# Patient Record
Sex: Female | Born: 1955 | Race: White | Hispanic: No | Smoking: Former smoker
Health system: Southern US, Community
[De-identification: ages and names within clinical notes are randomized; demographics above are authoritative.]

## PROBLEM LIST (undated history)

## (undated) DIAGNOSIS — M51369 Other intervertebral disc degeneration, lumbar region without mention of lumbar back pain or lower extremity pain: Secondary | ICD-10-CM

## (undated) DIAGNOSIS — K219 Gastro-esophageal reflux disease without esophagitis: Secondary | ICD-10-CM

## (undated) DIAGNOSIS — N3941 Urge incontinence: Secondary | ICD-10-CM

## (undated) DIAGNOSIS — F32A Depression, unspecified: Secondary | ICD-10-CM

## (undated) DIAGNOSIS — F3110 Bipolar disorder, current episode manic without psychotic features, unspecified: Secondary | ICD-10-CM

## (undated) DIAGNOSIS — K2270 Barrett's esophagus without dysplasia: Secondary | ICD-10-CM

## (undated) DIAGNOSIS — N200 Calculus of kidney: Secondary | ICD-10-CM

## (undated) DIAGNOSIS — F319 Bipolar disorder, unspecified: Secondary | ICD-10-CM

## (undated) DIAGNOSIS — M1990 Unspecified osteoarthritis, unspecified site: Secondary | ICD-10-CM

## (undated) DIAGNOSIS — F4310 Post-traumatic stress disorder, unspecified: Secondary | ICD-10-CM

## (undated) DIAGNOSIS — I10 Essential (primary) hypertension: Secondary | ICD-10-CM

## (undated) DIAGNOSIS — E039 Hypothyroidism, unspecified: Secondary | ICD-10-CM

## (undated) DIAGNOSIS — M5417 Radiculopathy, lumbosacral region: Secondary | ICD-10-CM

## (undated) DIAGNOSIS — E079 Disorder of thyroid, unspecified: Secondary | ICD-10-CM

## (undated) DIAGNOSIS — E119 Type 2 diabetes mellitus without complications: Secondary | ICD-10-CM

## (undated) DIAGNOSIS — T83721A Exposure of implanted vaginal mesh and other prosthetic materials into vagina, initial encounter: Secondary | ICD-10-CM

## (undated) DIAGNOSIS — K760 Fatty (change of) liver, not elsewhere classified: Secondary | ICD-10-CM

## (undated) DIAGNOSIS — G2581 Restless legs syndrome: Secondary | ICD-10-CM

## (undated) DIAGNOSIS — M5430 Sciatica, unspecified side: Secondary | ICD-10-CM

## (undated) DIAGNOSIS — Z87442 Personal history of urinary calculi: Secondary | ICD-10-CM

## (undated) DIAGNOSIS — L900 Lichen sclerosus et atrophicus: Secondary | ICD-10-CM

## (undated) DIAGNOSIS — F329 Major depressive disorder, single episode, unspecified: Secondary | ICD-10-CM

## (undated) DIAGNOSIS — F419 Anxiety disorder, unspecified: Secondary | ICD-10-CM

## (undated) DIAGNOSIS — M19019 Primary osteoarthritis, unspecified shoulder: Secondary | ICD-10-CM

## (undated) DIAGNOSIS — E7800 Pure hypercholesterolemia, unspecified: Secondary | ICD-10-CM

## (undated) HISTORY — PX: CYSTOURETHROSCOPY: SHX476

## (undated) HISTORY — PX: OOPHORECTOMY: SHX86

## (undated) HISTORY — PX: JOINT REPLACEMENT: SHX530

## (undated) HISTORY — PX: PERCUTANEOUS NEPHROSTOLITHOTOMY: SHX2207

## (undated) HISTORY — DX: Disorder of thyroid, unspecified: E07.9

## (undated) HISTORY — PX: CYSTOCELE REPAIR: SHX163

## (undated) HISTORY — DX: Major depressive disorder, single episode, unspecified: F32.9

## (undated) HISTORY — PX: TUBAL LIGATION: SHX77

## (undated) HISTORY — PX: MENISCECTOMY: SHX123

## (undated) HISTORY — DX: Unspecified osteoarthritis, unspecified site: M19.90

## (undated) HISTORY — PX: HERNIA REPAIR: SHX51

## (undated) HISTORY — PX: BREAST SURGERY: SHX581

## (undated) HISTORY — DX: Barrett's esophagus without dysplasia: K22.70

## (undated) HISTORY — DX: Type 2 diabetes mellitus without complications: E11.9

## (undated) HISTORY — DX: Gastro-esophageal reflux disease without esophagitis: K21.9

## (undated) HISTORY — DX: Depression, unspecified: F32.A

---
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
## 2007-08-13 HISTORY — PX: BLADDER SUSPENSION: SHX72

## 2008-02-29 ENCOUNTER — Ambulatory Visit: Payer: Self-pay | Admitting: Family Medicine

## 2008-03-15 ENCOUNTER — Ambulatory Visit: Payer: Self-pay | Admitting: Family Medicine

## 2008-04-01 ENCOUNTER — Ambulatory Visit: Payer: Self-pay | Admitting: Family Medicine

## 2008-04-06 ENCOUNTER — Ambulatory Visit: Payer: Self-pay | Admitting: Surgery

## 2008-04-11 ENCOUNTER — Ambulatory Visit: Payer: Self-pay | Admitting: Surgery

## 2008-04-11 HISTORY — PX: BREAST EXCISIONAL BIOPSY: SUR124

## 2008-08-12 HISTORY — PX: REPLACEMENT TOTAL KNEE: SUR1224

## 2009-06-12 ENCOUNTER — Ambulatory Visit: Payer: Self-pay | Admitting: Internal Medicine

## 2009-06-19 ENCOUNTER — Emergency Department: Payer: Self-pay | Admitting: Emergency Medicine

## 2009-06-23 ENCOUNTER — Ambulatory Visit: Payer: Self-pay | Admitting: Internal Medicine

## 2009-10-05 ENCOUNTER — Ambulatory Visit: Payer: Self-pay | Admitting: Family Medicine

## 2009-11-01 ENCOUNTER — Ambulatory Visit: Payer: Self-pay

## 2010-05-16 ENCOUNTER — Ambulatory Visit: Payer: Self-pay | Admitting: Surgery

## 2011-04-02 ENCOUNTER — Ambulatory Visit: Payer: Self-pay | Admitting: Family Medicine

## 2012-08-17 LAB — COMPREHENSIVE METABOLIC PANEL
Alkaline Phosphatase: 118 U/L (ref 50–136)
BUN: 29 mg/dL — ABNORMAL HIGH (ref 7–18)
Bilirubin,Total: 0.6 mg/dL (ref 0.2–1.0)
Chloride: 104 mmol/L (ref 98–107)
Co2: 22 mmol/L (ref 21–32)
Creatinine: 1.26 mg/dL (ref 0.60–1.30)
EGFR (Non-African Amer.): 48 — ABNORMAL LOW
Glucose: 132 mg/dL — ABNORMAL HIGH (ref 65–99)
Osmolality: 285 (ref 275–301)
SGOT(AST): 66 U/L — ABNORMAL HIGH (ref 15–37)

## 2012-08-17 LAB — ACETAMINOPHEN LEVEL: Acetaminophen: <2 ug/mL

## 2012-08-17 LAB — TSH: Thyroid Stimulating Horm: 83.7 u[IU]/mL — ABNORMAL HIGH

## 2012-08-17 LAB — CBC
HGB: 14.5 g/dL (ref 12.0–16.0)
MCHC: 34.4 g/dL (ref 32.0–36.0)
WBC: 9.1 10*3/uL (ref 3.6–11.0)

## 2012-08-18 ENCOUNTER — Inpatient Hospital Stay: Payer: Self-pay | Admitting: Psychiatry

## 2012-08-18 LAB — DRUG SCREEN, URINE
Amphetamines, Ur Screen: NEGATIVE (ref ?–1000)
Benzodiazepine, Ur Scrn: NEGATIVE (ref ?–200)
Cannabinoid 50 Ng, Ur ~~LOC~~: NEGATIVE (ref ?–50)
MDMA (Ecstasy)Ur Screen: NEGATIVE (ref ?–500)
Phencyclidine (PCP) Ur S: NEGATIVE (ref ?–25)

## 2012-08-20 LAB — LIPID PANEL
Ldl Cholesterol, Calc: 129 mg/dL — ABNORMAL HIGH (ref 0–100)
Triglycerides: 200 mg/dL (ref 0–200)
VLDL Cholesterol, Calc: 40 mg/dL (ref 5–40)

## 2012-08-23 LAB — TSH: Thyroid Stimulating Horm: 72.4 u[IU]/mL — ABNORMAL HIGH

## 2012-08-27 LAB — T4, FREE: Free Thyroxine: 0.71 ng/dL — ABNORMAL LOW (ref 0.76–1.46)

## 2012-09-14 ENCOUNTER — Ambulatory Visit: Payer: Self-pay | Admitting: Family Medicine

## 2012-09-16 ENCOUNTER — Ambulatory Visit: Payer: Self-pay | Admitting: Surgery

## 2013-03-21 ENCOUNTER — Inpatient Hospital Stay: Payer: Self-pay | Admitting: Psychiatry

## 2013-03-21 LAB — DRUG SCREEN, URINE
Amphetamines, Ur Screen: NEGATIVE (ref ?–1000)
Benzodiazepine, Ur Scrn: NEGATIVE (ref ?–200)
Cocaine Metabolite,Ur ~~LOC~~: NEGATIVE (ref ?–300)
MDMA (Ecstasy)Ur Screen: NEGATIVE (ref ?–500)
Methadone, Ur Screen: NEGATIVE (ref ?–300)
Opiate, Ur Screen: NEGATIVE (ref ?–300)
Phencyclidine (PCP) Ur S: NEGATIVE (ref ?–25)
Tricyclic, Ur Screen: NEGATIVE (ref ?–1000)

## 2013-03-21 LAB — COMPREHENSIVE METABOLIC PANEL
Anion Gap: 5 — ABNORMAL LOW (ref 7–16)
BUN: 16 mg/dL (ref 7–18)
Bilirubin,Total: 0.6 mg/dL (ref 0.2–1.0)
Chloride: 107 mmol/L (ref 98–107)
Co2: 25 mmol/L (ref 21–32)
Creatinine: 0.62 mg/dL (ref 0.60–1.30)
Glucose: 126 mg/dL — ABNORMAL HIGH (ref 65–99)
Osmolality: 277 (ref 275–301)
Potassium: 4.2 mmol/L (ref 3.5–5.1)

## 2013-03-21 LAB — TSH: Thyroid Stimulating Horm: 6.81 u[IU]/mL — ABNORMAL HIGH

## 2013-03-21 LAB — URINALYSIS, COMPLETE
Bacteria: NONE SEEN
Bilirubin,UR: NEGATIVE
Ph: 6 (ref 4.5–8.0)
RBC,UR: 3 /HPF (ref 0–5)
Squamous Epithelial: <1
WBC UR: 8 /HPF (ref 0–5)

## 2013-03-21 LAB — ACETAMINOPHEN LEVEL: Acetaminophen: 8 ug/mL — ABNORMAL LOW

## 2013-03-21 LAB — CBC
HCT: 36.4 % (ref 35.0–47.0)
MCH: 31.9 pg (ref 26.0–34.0)
MCHC: 35.1 g/dL (ref 32.0–36.0)
MCV: 91 fL (ref 80–100)
Platelet: 212 10*3/uL (ref 150–440)
RBC: 4.02 10*6/uL (ref 3.80–5.20)
RDW: 14 % (ref 11.5–14.5)

## 2013-03-21 LAB — ETHANOL: Ethanol: <3 mg/dL

## 2013-03-21 LAB — SALICYLATE LEVEL: Salicylates, Serum: 1.8 mg/dL

## 2013-04-23 ENCOUNTER — Ambulatory Visit: Payer: Self-pay | Admitting: Emergency Medicine

## 2013-08-25 ENCOUNTER — Ambulatory Visit: Payer: Self-pay | Admitting: Gastroenterology

## 2013-09-21 ENCOUNTER — Ambulatory Visit: Payer: Self-pay | Admitting: Gastroenterology

## 2013-10-16 ENCOUNTER — Ambulatory Visit: Payer: Self-pay | Admitting: Internal Medicine

## 2014-12-02 NOTE — Consult Note (Signed)
Chief Complaint:   Chief Complaint Paranoia   Presenting Symptoms:   Presenting Symptoms Anger/irritability  Sleep Disturbance  Mood Disturbance  Impaired Memory  Impaired Concentration  Apathy/Lethargy  Hallucinations  Delusions  Substance Use   History of Present Illness:   History of Present Illness Pt is a 59 yo WF admitted through the mobile crisis as she was found to be responding to internal stimuli, delusional and paranoid at the [employer]. She stated that she relocated to [place] 8 years ago and has been living with her ex husband and a room mate. She has a history of PTSD and recently found out that her mother passed away 2 days ago.  It really affected her and caused worsening of her PTSD symptoms. She does not want to go into the details of her symptoms She has rambling speech and was agitated and was unable to describe. She stated initially that she is in [place], then she said that she moved to [facility]. However, later she stated that she lives here. Stated that she baby sits her grand children but she cannot do it any more as she is feeling agiated and fuzy now. . It really affected her and caused worsening of her PTSD symptoms. She does not want to go into the details of her symptoms. However, she reported that she also used Crack with her friends. Her roommate gave her Vicodin to help with the back pain and it "messed her up".She continued to have rambling speech. She reported that her mood is very depressed and anxious. She remained tangential and was unable to contract for safety.   Target Symptoms:   Depressive Sleep Change  Suicidality    Manic Pressured Speech  Impaired Judgment  Impulsivity    Psychosis Disorganization  Negative Symptoms  Agitation  Paranoia    Behavior Impulsivity  Agitation  Aggression  Disinhibition    Arousal/Cognitive Altered Consciousness  Inattention  Behavior Disturbances   Past Psychiatric Treatment: First Treatment: Stated that she  was DX with PTSD in [place]. She was tried on several meds in the past. She had tried Lithium, Depakote, Haldol, Zyprexa, Risperdal, Seroquel, Thorazine, Prozac, Paxil and  Zoloft.   History of Suicide Attempts: Denied h/o suicide attempts.  Substance Abuse- Cocaine: Used crack recently .  Substance Abuse- Opiates: Used Vicodin from her friend recently.  Substance Abuse- Cannabis: The patient denies any cannabis use.Marland Kitchen  PAST MEDICAL & SURGICAL HX:  Significant Events:   Bipolar:    hyperthryoidism:    GERD - Esophageal Reflux:    Post-traumatic Stress Disorder:    Depression:    Osteoarthritis:    Chronic Myofascial Pain Syndrome:    Salpingo-Oophorectomy; Left \{Cysts\}:    Arthroscopy; Right Knee x 1:    Arthroscopy; Left Knee x 2:    Cesarean Section x 1:   CURRENT OUTPATIENT MEDICATIONS:  Home Medications: Medication Instructions Status  levothyroxine  orally  Active   Family History: The patient denies any history of mental illness in the family..  Social History: Lives with ex husband and room mate.  Mental Status Exam:   Speech Pressured    Mood Irritable    Affect Labile    Thought Processes Disorganized  Flight of ideas    Orientation Self  Place    Attention Lethargic    Memory Impaired    Fund of Knowledge Fair    Language Fair    Judgement Poor    Insight Poor    Reliabiity Fair  Suicide Risk Assessment: Suicide Risk Level No risk inidicated.  Review of Systems:  Review of Systems:   Fever/Chills No    Cough No    Sputum No    Abdominal Pain No    Diarrhea No    Constipation No    Nausea/Vomiting No    SOB/DOE No    Chest Pain No    Telemetry Reviewed Afib    Dysuria No    Medications/Allergies Reviewed Medications/Allergies reviewed   NURSING FLOWSHEETS:  Vital Signs/Nurse Notes-CM: ED Vital Sign Flow Sheet:   07-Jan-14 08:00   Vital Signs Type REFUSED VS   Assessment & Diagnosis: Axis I: PTSD   Psychotic DO NOS.   Axis II: none.   Axis III: See PMH.   Axis IV: Problems with primary support group  Problems related to social environment  Relationship problems .   Axis V: GAF 20.  Treatment Plan: Patient is aware of and understands the risks and benefits of the proposed treatment or treatment changes..   [facility] Start Seroquel 100mg  po qhs Prozac 20mg  po qam. Cogentin 1mg  po qhs  Group and milieu therapy Discharge planning once stable.  ELS  5-6 days.  Thank you for allowing me to participate me in the care of this pt.  Electronic Signatures: Rhunette CroftFaheem, Hyden Soley S (MD)  (Signed 07-Jan-14 11:21)  Authored: Chief Complaint, Presenting Symptoms, History of Present Illness, Target Symptoms, Past Psychiatric Treatment, Substance Abuse History, PAST MEDICAL & SURGICAL HX, CURRENT OUTPATIENT MEDICATIONS, Family History, Social History, Mental Status Exam, Suicide Risk Assessment, Review of Systems, NURSING FLOWSHEETS, Assessment & Diagnosis, Treatment Plan   Last Updated: 07-Jan-14 11:21 by Rhunette CroftFaheem, Taquila Leys S (MD)
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
## 2014-12-02 NOTE — H&P (Signed)
PATIENT NAME:  [name], [name] DATE OF BIRTH:  [birth year]  DATE OF ADMISSION:  08/18/2012  IDENTIFYING INFORMATION AND CHIEF COMPLAINT: A 59 year old woman admitted due to agitation, erratic thinking, mood instability.   CHIEF COMPLAINT: "I'm overwhelmed."   HISTORY OF PRESENT ILLNESS: The patient came to the Emergency Room stating that she was feeling overwhelmed. She presented to the initial observers as being disorganized in her thinking and probably psychotic. She tells me that she has been overwhelmed by multiple major life stresses. As a result, she recently has found herself emotionally unstable. Crying a great deal. Sleep has been erratic. Has had passive suicidal thoughts, but no active suicidal thoughts. Vaguely paranoid, but not obviously psychotic. Does have racing thoughts. Crying a great deal. Feels like she does not know what to do. She admits that she used some cocaine the other day and tries to minimize it, but does say that she has recently been doing that a little more frequently, maybe every couple of weeks. Denies that she is drinking alcohol frequently. She is not getting any outpatient psychiatric treatment despite a past history of psychiatric problems. Has not been on any psychiatric medication. It also appears that she has probably been noncompliant with her regular medical medication.   PAST PSYCHIATRIC HISTORY: Has never been seen at our hospital, but says that she had multiple admissions to hospitals in [place] where she lived before moving here about 9 years ago. She had been given a diagnosis of bipolar disorder and PTSD. She claims medication was never really helpful for her and she prefers psychotherapy. She particularly claims that lithium made her swell up. She admits that she has a past history of suicide attempts. Denies a history of violence.   SUBSTANCE ABUSE HISTORY: Currently has been using cocaine a little more frequently. Denies that she is  using any other drugs. She is very evasive on this subject.   PAST MEDICAL HISTORY: The patient had hyperthyroidism in the past and then was treated with radioablation and now has hypothyroidism. She claims that she has been taking her medication but does not know what her dosage, and her current labs suggests she probably has not been compliant. She also states that she has nausea and gastric reflux frequently that has been getting worse. She claims that she has been diagnosed with a ventral hernia. Says that it has been feeling more uncomfortable recently. She recently injured her right toe and is complaining that it is hurting a great deal.   SOCIAL HISTORY: The patient lives with her ex-husband who she now considers a roommate. Also another female roommate. She gets disability. She has children here in the area and seems to have a somewhat chaotic emotional relationship with them.   FAMILY HISTORY: She is estranged from her family of origin and does not have a clear idea.   CURRENT MEDICATIONS: All she can tell me is that she is on levothyroxine, does not remember the dose. Also has been taking 800 mg of Motrin once or twice a day p.r.n. for pain.   ALLERGIES: HALDOL AND TRAMADOL.   REVIEW OF SYSTEMS: Complains of feeling overwhelmed. Sad. Tearful. No active suicidal thoughts. Evasive about hallucinations. Multiple physical complaints including a painful toe, nausea, discomfort in her abdomen.   MENTAL STATUS EXAMINATION: Disheveled, poor hygiene looking woman. Cooperative but at times hard to redirect. Eye contact decreased. Psychomotor activity fidgety. Speech is at times rapid and pressured. Mood is labile, going from crying to  smiling very quickly. Overall tearful. Mood is stated as overwhelmed. Thoughts do not show obvious delusions, but she is quite disorganized with flight of ideas. Denies suicidal or homicidal ideation, but says that she would not mind if she died. Judgment and insight  questionable. Baseline intelligence probably normal.   PHYSICAL EXAMINATION:  GENERAL: Obese woman who looks her stated age. She has no obvious acute skin lesions, except that her right great toe does have a blackening under the toenail as though it has had a recent trauma. Her right great toe is also mildly swollen and red and appears to be tender to the touch.  HEENT: Pupils are equal and reactive. Face is symmetric.  NECK AND BACK: Nontender. EXTREMITIES: The patient walks with a limp favoring the injured toe. Otherwise has full range of motion at extremities.  ABDOMEN: Normal bowel sounds. I do not detect an obvious ventral hernia but could not rule it out.  LUNGS: Clear with no wheezes.  HEART: Regular rate and rhythm.  NEUROLOGIC: Strength and reflexes normal, symmetric. Cranial nerves intact.  VITAL SIGNS: Pulse 73, respirations 18, blood pressure 82/70.   ASSESSMENT: A 59 year old woman who appears to be presenting with a mixed manic, depressed episode of bipolar disorder. Could just be an agitated depression. Appeared more psychotic when she first came to the Emergency Room and has settled down now. Has been noncompliant with mental health treatment as well as medical treatment. Needs hospitalization for safety.   TREATMENT PLAN: Admit to psychiatry. I am going to provide nightly Seroquel plus p.r.n. Seroquel and Ativan during the day for anxiety. Will get an endocrine consult and start her back on an arbitrary initial dose of 100 mcg a day of Synthroid until the endocrinologist sees her. Get a podiatry consult if it can be done soon. Engage her in groups and activities and try and get some collateral information. Review labs.   DIAGNOSES PRINCIPAL AND PRIMARY:  AXIS I:  1.  Bipolar disorder, not otherwise specified.  2.  Cocaine abuse.  3.  Posttraumatic stress disorder by history.  AXIS II: Deferred.  AXIS III: Obesity, hypothyroidism, rule out ventral hernia, recent traumatic  injury to the toe.  AXIS IV: Moderate to severe from multiple recent stresses.  AXIS V: Functioning at time of evaluation: 35.   ____________________________ Audery Amel, MD jtc:jm D: 08/18/2012 17:52:11 ET T: 08/18/2012 18:45:08 ET JOB#: [id number]  cc: Audery Amel, MD, <Dictator> Audery Amel MD ELECTRONICALLY SIGNED 08/19/2012 10:11

## 2014-12-02 NOTE — Consult Note (Signed)
PATIENT NAME:  [name], TIETJE MR#:  [id number] DATE OF BIRTH:  [birth year]  DATE OF ADMISSION: 08/18/1998  DATE OF CONSULTATION:  08/27/2012  REFERRING PHYSICIAN:   CONSULTING PHYSICIAN:  Carmie End, MD  ADMITTING PHYSICIAN: Annett Gula, MD   PRIMARY CARE PHYSICIAN: Dr. Zada Finders   CHIEF COMPLAINT: Abdominal pain.   BRIEF HISTORY: The patient is a 59 year old woman seen on the behavior medicine unit for evaluation of a possible ventral hernia. She has had abdominal discomfort and a mass for some time, evaluated in the office several years ago. At the time of original evaluation, it was difficult to determine whether or not she had a hernia in the midline. She has no previous history of abdominal surgery in the upper midline and so it was felt that she would have an epigastric hernia, if there was indeed a hernia defect at all. She was overweight and it was difficult to be able to determine on clinical examination. We did not perform a CT scan at that time. She continued to have symptoms and underwent abdominal CT scan in October 2011. At that time, she had a small epigastric defect noted with only some preperitoneal fat in the defect. There did not appear to be any evidence of bowel in the defect. She had no further symptoms at the time. She was recently readmitted for anxiety and depression to the behavioral medicine unit. She has been evaluated there for the last 10 days. She is being set up for discharge and noted an increase in her symptoms and is concerned about a possibility of an obstruction. The surgical service was consulted.   PAST SURGICAL HISTORY:  She denies any previous upper abdominal surgery. Her only previous surgery has been a tubal, a partial hysterectomy, left oophorectomy and salpingectomy and a lower Pfannenstiel hernia repair. She has had no recent surgical intervention in her abdomen. She denies any nausea or vomiting, although she does have intermittent dysphagia. She has  no history of rectal bleeding or change in her stool habits. She has no history of cardiac disease, hypertension or diabetes.   CURRENT MEDICATIONS: Include ibuprofen, levothyroxine and Zyrtec.   ALLERGIES: SHE IS ALLERGIC TO HALDOL AND TRAMADOL. BOTH CAUSE GI DISTRESS.   FAMILY HISTORY: Noncontributory.   REVIEW OF SYSTEMS: Not performed.   PHYSICAL EXAMINATION: GENERAL: She is an alert, very anxious, but otherwise comfortable woman in no significant distress. She is afebrile. Heart rate 72 and regular, blood pressure is 148/87. She has normal oxygen saturation.  HEENT: Reveals no scleral icterus. No facial deformities. No pupillary abnormalities.  NECK: Supple without adenopathy or tenderness, with midline trachea.  PULMONARY: Chest is clear with distant breath sounds, but they appear to be equal. She has normal pulmonary excursion.  CARDIAC: Reveals no murmurs or gallops to my ear, and she seems to be in normal sinus rhythm.  ABDOMEN: Distended and obese. She does have what appears to be preperitoneal fat in an upper midline defect. It does appear larger than on the previous examination, I suspect on CT would also be larger.  Lower abdominal exam reveals no hernias, no masses and she has no significant abdominal tenderness.   EXTREMITIES: Lower extremity exam reveals full range of motion. No deformities. No edema.  PSYCHIATRIC: Deferred to the behavioral medical unit.   ASSESSMENT AND PLAN: I have independently reviewed her CT scan. She does have a small peritoneal defect on a previous scan, but has not had a recent evaluation. She likely does  have an epigastric hernia responsible for her current symptoms. There is certainly no bowel in this defect and no indication for urgent surgical intervention. At some point she may need to be considered for repeat CT scan and surgery if she so desires. This problem is certainly one that can be followed as an outpatient. We would consent to see her in  our office as an outpatient for further evaluation. I do not see any indication for surgical intervention urgently. This plan has been described with her in detail and she is in agreement.     ____________________________ Carmie Endalph L. Ely III, MD rle:cc D: 08/27/2012 18:34:00 ET T: 08/27/2012 19:02:12 ET JOB#: [id number]  cc: Quentin Orealph L. Ely III, MD, <Dictator> Dr. Adline Mangolmeda  Madgie Dhaliwal L ELY MD ELECTRONICALLY SIGNED 08/28/2012 8:49

## 2014-12-02 NOTE — Consult Note (Signed)
Chief Complaint and History:   Primary Physician Dr. Zada Finderslmedo, [facility] [place]    Referring Physician Dr. Toni Amendlapacs    Chief Complaint Hypothyroidism   Allergies:  Tramadol: N/V  Haldol: Other  Assessment/Plan:   Assessment/Plan 59 yo F admitted for psychosis and found to have uncontrolled hypothyroidism. She claims she had radioiodine for hyperthyroidism several years ago. She is a poor historian. She claims she is compliant with levothyroxine and that she takes it at midnight when she wakes from sleep to urinate. She claims she rarely msises a dose. Her TSH level on 08/17/12 was 83. I called her pharmacy, [facility] in [place], and was told she picked up a 90 day prescription for 150 mcg levothyroxine on 06/22/12.  A/ 1. Post-ablative hypothyroidism, uncontrolled  P/ 1. Increase levothyroxine to 150 mcg daily. 2. Check a TSH and free T4 in 4 days.  I will follow-up after repeat labs are done. A full consult will be dictated.    Case Discussed With patient   Electronic Signatures: Raj JanusSolum, Anna M (MD)  (Signed 09-Jan-14 13:33)  Authored: Chief Complaint and History, ALLERGIES, Assessment/Plan   Last Updated: 09-Jan-14 13:33 by Raj JanusSolum, Anna M (MD)

## 2014-12-02 NOTE — Consult Note (Signed)
PATIENT NAME:  [name], [name] MR#:  [id number] DATE OF BIRTH:  [birth year]  DATE OF CONSULTATION:  08/20/2012  REFERRING PHYSICIAN:  Gonzella Lex, MD CONSULTING PHYSICIAN:  A. Lavone Orn, MD  CHIEF COMPLAINT: Elevated TSH.   HISTORY OF PRESENT ILLNESS: This is a 59 year old female who was admitted on 08/18/2012 for concerns of psychosis. Initial labs showed an elevated TSH level of 83.7. The patient reports she has a history of hyperthyroidism. She claims that she was treated with radioiodine several years ago. Initially she stated this was 6 months ago and later said it was 3 years ago. She is a fairly poor historian. She claims her treatment was done at [facility] in [place]. She claims Dr. 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 She does not recall her dose of levothyroxine. She claims she takes it around midnight. She is aware she should be taking it on an empty stomach; thus, she waits until she wakes up in the middle of the night to urinate and then takes it. She claims she is fairly faithful about taking her levothyroxine. She estimates she maybe has missed 1 dose in the last month. She does feel cold. She does admit to depression. She reports her hair is frail and falling out. She reports dry skin. She has chronic constipation.   PAST MEDICAL HISTORY:  1.  Postablative hypothyroidism.  2.  History of hyperthyroidism, status post radioiodine ablation at [facility].  3.  Possible history of bipolar and PTSD.  4.  Tobacco dependence.  5.  Morbid obesity.   PAST SURGICAL HISTORY:  1.  Bilateral knee replacement.  2.  Bilateral salpingo-oophorectomy.  3.  Left breast lumpectomy.  4.  Bladder sling/repair.   SOCIAL HISTORY: The patient claims she lives with her son. She has 4 children in total. She been married 3 times. Her last husband was killed from a gunshot wound, which she witnessed. She is unemployed. She  smokes cigarettes. She claims she had not used cocaine for 9 years up until a recent relapse over the last several weeks. She smokes marijuana on occasion. She admits to use of her husband's prescription Vicodin, about 3 tabs per month. She has occasional alcohol use.   FAMILY HISTORY: Mother may have had hypothyroidism, details not clear. She states she has been out of touch with her family.   ALLERGIES: HALDOL AND TRAMADOL.   INPATIENT MEDICATIONS:  1.  Prilosec 40 mg b.i.d.  2.  Levothyroxine 100 mcg daily.  3.  Seroquel 100 mg at bedtime.   REVIEW OF SYSTEMS:  GENERAL: She has fatigue. She has had weight gain. She denies fevers.  HEENT: She denies blurred vision. She denies sore throat.  CARDIAC: She denies chest pain. She denies palpitations.  PULMONARY: She has dyspnea on exertion. She denies cough.  ABDOMEN: She reports abdominal discomfort. She is concerned there is a mass in her stomach that has been growing. She has constipation. She has fair appetite.  EXTREMITIES: She denies leg swelling.  SKIN: She denies rash or pruritus.  ENDOCRINE: She reports cold intolerance. She denies polyuria.  GENITOURINARY: She denies dysuria or hematuria.  PSYCHIATRIC: She reports anxiety and depression.   PHYSICAL EXAMINATION:  VITAL SIGNS: Height 66.9 inches, weight 242 pounds, BMI 37.9. Temperature 97.1, pulse 71, respirations 18, blood pressure 112/69.  GENERAL: Morbidly obese white female in no distress.  HEENT: EOMI. Oropharynx is clear. Mucous membranes moist.  NECK: Supple. No  thyromegaly. No palpable thyroid nodules.  CARDIAC: Regular rate and rhythm. No carotid bruit.  PULMONARY: Clear bilaterally. No wheeze. Good inspiratory effort.  ABDOMEN: Diffusely soft, nontender, nondistended. No appreciable masses. No hepatosplenomegaly appreciated.  EXTREMITIES: No edema is present.  SKIN: No rash or dermatopathy is present. No acanthosis nigricans is present.  PSYCHIATRIC: Disheveled, good  eye contact, calm, cooperative.   LABORATORY DATA: On 08/17/2012: Glucose 132, BUN 29, creatinine 1.26, potassium 3.5, sodium 139, eGFR 48, calcium 9.1. Ethanol less than 0.003. Total protein 9.1, albumin 5.1, AST 66, ALT 62. TSH 83.7. Urine drug screen positive for cocaine and opiates. Hemoglobin 14.5, hematocrit 42.1, WBC 9.1, platelets 246. Acetaminophen less than 2.0. Salicylate less than 1.7. On 08/20/2012: Total cholesterol 210, triglycerides 200, HDL 41, LDL 129.   ASSESSMENT: A 59 year old female with history of hyperthyroidism treated with radioiodine ablation sometime in the past, now with postablative hypothyroidism, which is uncontrolled. Suspect that her hypothyroidism is uncontrolled due to noncompliance with levothyroxine. I called her pharmacy, which is [facility] in [place]. I was told that she filled a prescription for levothyroxine 150 mcg 90-day supply on 06/22/2012. They had never received a prior prescription on levothyroxine for that patient.   RECOMMENDATIONS:  1.  Increase levothyroxine to 150 mcg per day.  2.  Continue giving in morning. I recommend morning dosing for best compliance, with waiting 30 to 60 minutes before having breakfast.  3.  Will repeat a free T4 and TSH in 4 days and reassess if she remains in-house.   Thank you for the kind request for consultation. I will follow along with you.    ____________________________ A. Lavone Orn, MD ams:jm D: 08/20/2012 13:42:49 ET T: 08/20/2012 15:00:26 ET JOB#: [id number]  cc: A. Lavone Orn, MD, <Dictator> Sherlon Handing MD ELECTRONICALLY SIGNED 09/03/2012 16:12

## 2014-12-02 NOTE — Discharge Summary (Signed)
PATIENT NAME:  [name], [name] DATE OF BIRTH:  14-Nov-1955  DATE OF ADMISSION:  08/18/2012 DATE OF DISCHARGE:  08/28/2012  HOSPITAL COURSE:  See dictated history and physical for details of admission. The patient was admitted to the hospital with depression, anxiety and confusion. Some suicidal ideation. In the hospital, she was treated for bipolar disorder which also may include elements of personality disorder and chronic anxiety. She has generally tolerated medication well. She has participated appropriately in groups and activities. She seems to have responded to medications. By the time of discharge, she is able to hold a calm, lucid conversation. Totally denies suicidal ideation. Able to identify positive things in her life that she is looking forward to. She still shows symptoms of chronic anxiety which may be a constant issue. She was agreeable however to following up with outpatient treatment in the community. She was treated also in the hospital for hypothyroidism, also was treated for chronic pain and gastric reflux symptoms.   DISCHARGE MEDICATIONS:  Quetiapine 50 mg p.o. q. 6 hours p.r.n. for anxiety, Synthroid 200 mcg p.o. q.a.m., Seroquel 300 mg at night, Neurontin 600 mg 3 times a day, Zyrtec 10 mg q.a.m., Celebrex 200 mg b.i.d., Ambien 10 mg at bedtime, hydroxyzine 50 mg 4 times a day, Prilosec 40 mg twice a day.   DIAGNOSTIC DATA:  Admission labs showed a chemistry panel with a slightly elevated glucose at 132, BUN elevated at 29. AST elevated at 66, total protein at 9.1, albumin at 5.1. Alcohol level was undetectable. CBC was normal. Salicylates normal. Thyroid-stimulating hormone was 83.7. Consultation was obtained with endocrinology. Drug screen was positive for cocaine and for opiates. Cholesterol was 210, LDL 129 and other lipids normal. Followup tests of thyroxine showed them to still be low even with supplementation. TSH was retested before discharge and had come down  just to 72. The patient complained of chronic discomfort in her abdomen and insinuated that she needed surgical consultation. An x-ray of the abdomen was eventually obtained and showed primarily a nonobstructive gas pattern and possible nephrolithiasis.   MENTAL STATUS EXAM AT DISCHARGE:  Casually dressed, reasonably well-groomed woman who looks her stated age. Somewhat irritable and grumpy still in interaction. Eye contact normal. Psychomotor activity a little fidgety. Speech is normal in rate, tone and volume. Affect anxious. Mood stated as being anxious. Thoughts are generally lucid. No sign of loosening of associations or delusions. Denies auditory or visual hallucinations. No sign of responding to internal stimuli. Denies suicidal or homicidal ideation. Shows decent insight and judgment. Normal intelligence.   DISPOSITION:  Discharge followup with [facility].   DIAGNOSIS, PRINCIPAL AND PRIMARY:  AXIS I: Bipolar disorder, not otherwise specified.   SECONDARY DIAGNOSES: AXIS I: Cocaine abuse.  AXIS II: Personality disorder, not otherwise specified, borderline features, prominent.  AXIS III: Hypothyroidism, chronic pain, gastric reflux.  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  AXIS V: Functioning at time of discharge 57.   ____________________________ Audery AmelJohn T. Seymore Brodowski, MD jtc:si D: 09/08/2012 17:15:00 ET T: 09/08/2012 17:50:34 ET JOB#: [id number]  cc: Audery AmelJohn T. Izayiah Tibbitts, MD, <Dictator> Audery AmelJOHN T Eleshia Wooley MD ELECTRONICALLY SIGNED 09/09/2012 18:32

## 2014-12-02 NOTE — Consult Note (Signed)
PATIENT NAME:  [name], [name] MR#:  [id number] DATE OF BIRTH:  Jun 26, 1956  DATE OF CONSULTATION:  08/20/2012  REFERRING PHYSICIAN:   CONSULTING PHYSICIAN:  Rhona RaiderMatthew G. Dejan Angert, DPM  REASON FOR CONSULTATION: Contusion of right great hallux nail. Date of injury is 08/17/2012.   HISTORY OF PRESENT ILLNESS: The patient was admitted to the hospital this week due to feelings of being overwhelmed. She is in behavior medicine. She states she hit her toe last Monday and it has been sore and painful since that timeframe. She noted immediate development of discoloration to the nail region with evidence of bleeding underneath the nail and quite a bit of pain. She has apparently done this at some point in the past because she describes it pretty well.   PAST MEDICAL HISTORY: She has iatrogenic hypothyroidism secondary to radioablation of hyperthyroid condition. She has had some issues with depression and is admitted for emotional issues at this time frame.   ALLERGIES: HALDOL AND TRAMADOL.   CURRENT MEDICATIONS:  1. Levothyroxine.  2. Motrin 800 mg periodically.   SOCIAL HISTORY: Positive for disability. Does admit to cocaine usage. She has an ex-husband she lives with. She has children in the area.   FAMILY HISTORY: Uncertain as she is estranged from her family.  PHYSICAL EXAMINATION:   GENERAL: She seems relatively alert at this point. She is a pleasant 59 year old Caucasian female. The only discomfort she has is in her great toe area.   LOWER EXTREMITY EXAM: Vascular DP pulses are +2/4 bilaterally.   DERMATOLOGIC: Skin texture and turgor is within normal limits. She has no ulcers, skin breaks or tissue breakdown. She has a contusion underneath the right hallux nail with a hematoma subungually on that region and some erythema and redness and swelling to the posterior nail fold at this juncture.   ORTHO: Good range of motion ankles, toes, metatarsal and metatarsophalangeal joints.   IMPRESSION:  Contusion of right hallux nail this past week.  TREATMENT PLAN: After an alcohol prep, I was able to go ahead and make two release holes in the nail area using an 18-gauge beveled needle. This allowed me to penetrate down into the nail to get below the subungual portion of it and allow drainage of the hematoma. Two such holes were placed to allow for better drainage. She tolerated that well. I think she will get a lot more comfort out of it. We cleaned it with alcohol at bedside. This was done by her bedside. No anesthesia was necessary. We put a dressing on the area at this time frame. She is to let me know if there are any particular problems. I spent some time talking to her about expectations and as the nail grows out it is going to loosen and fall off as the new nail grows. She is going to keep a Band-Aid on it. If it gets too loose or starts to irritate her or if she has any problems with it she can give my office a call and we will set her up to try to help her out with it. ____________________________ Rhona RaiderMatthew G. Texanna Hilburn, DPM mgt:sb D: 08/20/2012 15:46:41 ET T: 08/20/2012 16:08:44 ET JOB#: [id number]  cc: Rhona RaiderMatthew G. Dmarco Baldus, DPM, <Dictator> Epimenio SarinMATTHEW G Franny Selvage MD ELECTRONICALLY SIGNED 10/06/2012 14:31

## 2015-09-13 ENCOUNTER — Encounter: Payer: Self-pay | Admitting: *Deleted

## 2015-09-13 ENCOUNTER — Encounter: Payer: Medicare Other | Attending: Family Medicine | Admitting: *Deleted

## 2015-09-13 VITALS — BP 102/70 | Ht 67.0 in | Wt 241.4 lb

## 2015-09-13 DIAGNOSIS — E119 Type 2 diabetes mellitus without complications: Secondary | ICD-10-CM | POA: Diagnosis present

## 2015-09-13 NOTE — Patient Instructions (Addendum)
Check blood sugars 2 x day before breakfast and 2 hrs after supper 3-4 x week Exercise: Begin chair exercises  for   10-15  minutes  3  days a week Eat 3 meals day,  1-2 snacks a day Space meals 4-6 hours apart Don't skip meals Allow 2-3 hours between meals and snacks Avoid sugar sweetened drinks (soda, tea) Avoid adding sugar to cereal Bring blood sugar records to the next class

## 2015-09-14 NOTE — Progress Notes (Signed)
Diabetes Self-Management Education  Visit Type: First/Initial  Appt. Start Time: 0850 Appt. End Time: 1010  09/13/2015  [name], identified by name and date of birth, is a 60 y.o. female with a diagnosis of Diabetes: Type 2.   ASSESSMENT  Blood pressure 102/70, height  (1.702 m), weight 241 lb 6.4 oz (109.498 kg). Body mass index is 37.8 kg/(m^2).      Diabetes Self-Management Education - 09/13/15 1218    Visit Information   Visit Type First/Initial   Initial Visit   Diabetes Type Type 2   Are you taking your medications as prescribed? No   Date Diagnosed 2 years ago   Health Coping   How would you rate your overall health? Poor   Psychosocial Assessment   Patient Belief/Attitude about Diabetes Defeat/Burnout  "depressed"   Self-care barriers None   Self-management support Doctor's office   Patient Concerns Nutrition/Meal planning;Glycemic Control;Problem Solving;Weight Control;Healthy Lifestyle   Special Needs None   Preferred Learning Style Auditory;Visual;Hands on   Learning Readiness Ready   How often do you need to have someone help you when you read instructions, pamphlets, or other written materials from your doctor or pharmacy? 1 - Never   What is the last grade level you completed in school? 2 yrs college   Complications   Last HgB A1C per patient/outside source 6.6 %  08/30/15   How often do you check your blood sugar? 0 times/day (not testing)  Pt has a meter but hasn't checked blood sugars in last year.    Have you had a dilated eye exam in the past 12 months? Yes   Have you had a dental exam in the past 12 months? No  dentures   Are you checking your feet? No   Dietary Intake   Breakfast usually skips or eats egge, toast and fried potatoes or muffin or cereal and milk wtih sugar sprinkled on cereal   Lunch burrito or sandwich or soup   Snack (afternoon) Chips - snacks all throughout the day   Dinner chicken or potato soup or spaghetti or rice  with green beans   Beverage(s) root beer, sweet tea, diet soda, water   Exercise   Exercise Type ADL's   Patient Education   Previous Diabetes Education No   Disease state  Definition of diabetes, type 1 and 2, and the diagnosis of diabetes   Nutrition management  Role of diet in the treatment of diabetes and the relationship between the three main macronutrients and blood glucose level   Physical activity and exercise  Role of exercise on diabetes management, blood pressure control and cardiac health.   Medications Reviewed patients medication for diabetes, action, purpose, timing of dose and side effects.   Chronic complications Relationship between chronic complications and blood glucose control   Psychosocial adjustment Identified and addressed patients feelings and concerns about diabetes   Personal strategies to promote health Lifestyle issues that need to be addressed for better diabetes care  stop smoking   Individualized Goals (developed by patient)   Reducing Risk Improve blood sugars Decrease medications Prevent diabetes complications Lose weight Lead a healthier lifestyle   Outcomes   Expected Outcomes Demonstrated interest in learning. Expect positive outcomes      Individualized Plan for Diabetes Self-Management Training:   Learning Objective:  Patient will have a greater understanding of diabetes self-management. Patient education plan is to attend individual and/or group sessions per assessed needs and concerns.   Plan:  Patient Instructions  Check blood sugars 2 x day before breakfast and 2 hrs after supper 3-4 x week Exercise: Begin chair exercises  for   10-15  minutes  3  days a week Eat 3 meals day,  1-2 snacks a day Space meals 4-6 hours apart Don't skip meals Allow 2-3 hours between meals and snacks Avoid sugar sweetened drinks (soda, tea) Avoid adding sugar to cereal Bring blood sugar records to the next class  Expected Outcomes:  Demonstrated  interest in learning. Expect positive outcomes  Education material provided:  General Meal Planning Guidelines Simple Meal Plan  If problems or questions, patient to contact team via:   Sharion Settler, RN, CCM, CDE [phone]  Future DSME appointment:  September 21, 2015 for Class 1

## 2015-09-21 ENCOUNTER — Ambulatory Visit: Payer: Medicare Other

## 2015-09-28 ENCOUNTER — Ambulatory Visit: Payer: Medicare Other

## 2015-10-05 ENCOUNTER — Ambulatory Visit: Payer: Medicare Other

## 2015-10-12 ENCOUNTER — Ambulatory Visit: Payer: Medicare Other

## 2015-10-19 ENCOUNTER — Ambulatory Visit: Payer: Medicare Other

## 2015-10-24 ENCOUNTER — Encounter: Payer: Self-pay | Admitting: *Deleted

## 2015-10-25 ENCOUNTER — Ambulatory Visit: Payer: Medicare Other

## 2015-11-30 ENCOUNTER — Other Ambulatory Visit: Payer: Self-pay | Admitting: Family Medicine

## 2015-11-30 DIAGNOSIS — Z1231 Encounter for screening mammogram for malignant neoplasm of breast: Secondary | ICD-10-CM

## 2015-12-06 ENCOUNTER — Ambulatory Visit: Payer: Medicare Other | Attending: Family Medicine

## 2016-01-10 ENCOUNTER — Ambulatory Visit: Payer: Medicare Other

## 2016-01-17 ENCOUNTER — Ambulatory Visit: Admission: RE | Admit: 2016-01-17 | Payer: Medicare Other | Source: Ambulatory Visit

## 2016-01-18 ENCOUNTER — Ambulatory Visit
Admission: RE | Admit: 2016-01-18 | Discharge: 2016-01-18 | Disposition: A | Discharge status: Home / Self Care | Payer: Medicare Other | Source: Ambulatory Visit | Attending: Family Medicine | Admitting: Family Medicine

## 2016-01-18 DIAGNOSIS — Z1231 Encounter for screening mammogram for malignant neoplasm of breast: Secondary | ICD-10-CM | POA: Insufficient documentation

## 2016-04-09 ENCOUNTER — Encounter: Payer: Self-pay | Admitting: *Deleted

## 2016-04-09 ENCOUNTER — Ambulatory Visit
Admission: EM | Admit: 2016-04-09 | Discharge: 2016-04-09 | Disposition: A | Discharge status: Home / Self Care | Payer: Medicare Other | Attending: Family Medicine | Admitting: Family Medicine

## 2016-04-09 DIAGNOSIS — E079 Disorder of thyroid, unspecified: Secondary | ICD-10-CM | POA: Insufficient documentation

## 2016-04-09 DIAGNOSIS — N390 Urinary tract infection, site not specified: Secondary | ICD-10-CM | POA: Diagnosis not present

## 2016-04-09 DIAGNOSIS — E119 Type 2 diabetes mellitus without complications: Secondary | ICD-10-CM | POA: Insufficient documentation

## 2016-04-09 DIAGNOSIS — K219 Gastro-esophageal reflux disease without esophagitis: Secondary | ICD-10-CM | POA: Insufficient documentation

## 2016-04-09 DIAGNOSIS — Z7984 Long term (current) use of oral hypoglycemic drugs: Secondary | ICD-10-CM | POA: Insufficient documentation

## 2016-04-09 DIAGNOSIS — Z7982 Long term (current) use of aspirin: Secondary | ICD-10-CM | POA: Diagnosis not present

## 2016-04-09 DIAGNOSIS — F17210 Nicotine dependence, cigarettes, uncomplicated: Secondary | ICD-10-CM | POA: Diagnosis not present

## 2016-04-09 DIAGNOSIS — M5431 Sciatica, right side: Secondary | ICD-10-CM | POA: Diagnosis not present

## 2016-04-09 DIAGNOSIS — Z79899 Other long term (current) drug therapy: Secondary | ICD-10-CM | POA: Diagnosis not present

## 2016-04-09 DIAGNOSIS — M25512 Pain in left shoulder: Secondary | ICD-10-CM | POA: Diagnosis present

## 2016-04-09 LAB — URINALYSIS COMPLETE WITH MICROSCOPIC (ARMC ONLY)
Bilirubin Urine: NEGATIVE
Glucose, UA: NEGATIVE mg/dL
Ketones, ur: NEGATIVE mg/dL
Nitrite: NEGATIVE
Protein, ur: 100 mg/dL — AB
Specific Gravity, Urine: >1.03 — ABNORMAL HIGH (ref 1.005–1.030)
pH: 5 (ref 5.0–8.0)

## 2016-04-09 LAB — GLUCOSE, CAPILLARY: Glucose-Capillary: 94 mg/dL (ref 65–99)

## 2016-04-09 MED ORDER — CYCLOBENZAPRINE HCL 10 MG PO TABS: 10.0000 mg | ORAL_TABLET | Freq: Two times a day (BID) | ORAL | 0 refills | Status: DC | PRN

## 2016-04-09 MED ORDER — CEPHALEXIN 500 MG PO CAPS: 500.0000 mg | ORAL_CAPSULE | Freq: Two times a day (BID) | ORAL | 0 refills | Status: DC

## 2016-04-09 MED ORDER — HYDROCODONE-ACETAMINOPHEN 5-325 MG PO TABS: 1.0000 | ORAL_TABLET | Freq: Four times a day (QID) | ORAL | 0 refills | Status: DC | PRN

## 2016-04-09 NOTE — ED Provider Notes (Signed)
CSN: [id number]     Arrival date & time 04/09/16  0930 History   First MD Initiated Contact with Patient 04/09/16 1041     Chief Complaint  Patient presents with  . Back Pain  . Blood Sugar Problem   (Consider location/radiation/quality/duration/timing/severity/associated sxs/prior Treatment) HPI  This a 60 year old female who presents with several complaints. She complains of left shoulder pain which was injected by Dr. Gavin Potters and both feet pain which had been injected by a podiatrist. His shots contain prednisone. Noticed that her blood sugar hasn't increased to 190 and she has excessive thirst most likely from the prednisone.  Her most pressing problem is that of right-sided sciatica that she feels in her right buttock and radiates down into her leg. She states that she has been rock hunting and tailing bending and lifting [place] small rocks which is a fun activity for the city in [place]. June is to increase her exercise in order to decrease her diabetes.  Finally she's been having some urgency with urination. She has had a year urinary tract infections in the past but has been a long time. She has no vaginal discharge. She has a known kidney stone that has not moved in many many years.  Past Medical History:  Diagnosis Date  . Arthritis   . Barrett esophagus   . Breast mass 04/02/2011   mass removed from left negative  . Depression   . Diabetes mellitus without complication (HCC)   . GERD (gastroesophageal reflux disease)   . Thyroid disease    Past Surgical History:  Procedure Laterality Date  . BLADDER SUSPENSION  2009  . CESAREAN SECTION    . LAPAROSCOPIC SALPINGOOPHERECTOMY Left 1976  . MENISCECTOMY Bilateral 1980, 2000  . REPLACEMENT TOTAL KNEE Left 2010   Family History  Problem Relation Age of Onset  . Breast cancer Sister 33    half sister on fathers side   Social History  Substance Use Topics  . Smoking status: Current Some Day Smoker    Years: 10.00    Types:  Cigarettes  . Smokeless tobacco: Never Used     Comment: few cigarettes per week  . Alcohol use No   OB History    No data available     Review of Systems  Constitutional: Positive for activity change and fatigue. Negative for chills, diaphoresis and fever.  Musculoskeletal: Positive for back pain.  All other systems reviewed and are negative.   Allergies  Chloraseptic max sore throat  [phenol-glycerin]; Tramadol; and Haloperidol decanoate  Home Medications   Prior to Admission medications   Medication Sig Start Date End Date Taking? Authorizing Provider  cetirizine (ZYRTEC) 10 MG tablet Take 10 mg by mouth daily as needed. 02/01/15 04/09/16 Yes Historical Provider, MD  esomeprazole (NEXIUM) 20 MG capsule Take 1 capsule by mouth 2 (two) times daily. 02/22/15 04/09/16 Yes Historical Provider, MD  levothyroxine (SYNTHROID, LEVOTHROID) 175 MCG tablet Take 175 mcg by mouth daily. 09/01/15 08/31/16 Yes Historical Provider, MD  meloxicam (MOBIC) 15 MG tablet Take 15 mg by mouth daily. 08/30/15 08/29/16 Yes Historical Provider, MD  metFORMIN (GLUCOPHAGE-XR) 500 MG 24 hr tablet Take 2 tablets by mouth daily with supper. 02/22/15 04/09/16 Yes Historical Provider, MD  sitaGLIPtin (JANUVIA) 100 MG tablet Take 100 mg by mouth daily.   Yes Historical Provider, MD  aspirin EC 81 MG tablet Take 81 mg by mouth daily.    Historical Provider, MD  cephALEXin (KEFLEX) 500 MG capsule Take 1 capsule (500  mg total) by mouth 2 (two) times daily. 04/09/16   Lutricia Feil, PA-C  clonazePAM (KLONOPIN) 0.5 MG tablet Take 0.5 mg by mouth daily as needed.    Historical Provider, MD  cyclobenzaprine (FLEXERIL) 10 MG tablet Take 1 tablet (10 mg total) by mouth 2 (two) times daily as needed for muscle spasms. 04/09/16   Lutricia Feil, PA-C  HYDROcodone-acetaminophen (NORCO/VICODIN) 5-325 MG tablet Take 1 tablet by mouth every 6 (six) hours as needed for moderate pain. 04/09/16   Lutricia Feil, PA-C  lisinopril  (PRINIVIL,ZESTRIL) 2.5 MG tablet Take 2.5 mg by mouth daily. 09/01/15 08/31/16  Historical Provider, MD   Meds Ordered and Administered this Visit  Medications - No data to display  BP (!) 147/90 (BP Location: Left Arm)   Pulse 75   Temp 98 F (36.7 C) (Oral)   Resp (!) 22   Ht 5\' 7"  (1.702 m)   Wt 242 lb (109.8 kg)   SpO2 96%   BMI 37.90 kg/m  No data found.   Physical Exam  Constitutional: She is oriented to person, place, and time. She appears well-developed and well-nourished. No distress.  HENT:  Head: Normocephalic and atraumatic.  Eyes: EOM are normal. Pupils are equal, round, and reactive to light. Right eye exhibits no discharge. Left eye exhibits no discharge.  Neck: Normal range of motion. Neck supple.  Musculoskeletal: She exhibits tenderness. She exhibits no edema or deformity.  Examination of the spine is performed in the presence of Heather, RN acting as Nurse, children's. Patient has a level pelvis in stance. Maximal tenderness is over the right sacroiliac joint inferiorly. Right lateral flexion also exacerbates her symptoms. She is able to toe and heel walk adequately. DTRs are symmetrical bilaterally. Straight leg raise testing on the right causes mild sciatic notch tenderness. There is no cross component present.  Neurological: She is alert and oriented to person, place, and time.  Skin: Skin is warm and dry. No rash noted. She is not diaphoretic. No erythema.  Psychiatric: She has a normal mood and affect. Her behavior is normal. Judgment and thought content normal.  Nursing note and vitals reviewed.   Urgent Care Course   Clinical Course    Procedures (including critical care time)  Labs Review Labs Reviewed  URINALYSIS COMPLETEWITH MICROSCOPIC ([facility] ONLY) - Abnormal; Notable for the following:       Result Value   Specific Gravity, Urine >1.030 (*)    Hgb urine dipstick LARGE (*)    Protein, ur 100 (*)    Leukocytes, UA TRACE (*)    Bacteria,  UA MANY (*)    Squamous Epithelial / LPF 0-5 (*)    All other components within normal limits  URINE CULTURE  GLUCOSE, CAPILLARY  CBG MONITORING, ED    Imaging Review No results found.   Visual Acuity Review  Right Eye Distance:   Left Eye Distance:   Bilateral Distance:    Right Eye Near:   Left Eye Near:    Bilateral Near:         MDM   1. UTI (lower urinary tract infection)   2. Sciatica of right side    Discharge Medication List as of 04/09/2016 11:39 AM    START taking these medications   Details  cephALEXin (KEFLEX) 500 MG capsule Take 1 capsule (500 mg total) by mouth 2 (two) times daily., Starting Tue 04/09/2016, Normal    cyclobenzaprine (FLEXERIL) 10 MG tablet Take 1 tablet (10  mg total) by mouth 2 (two) times daily as needed for muscle spasms., Starting Tue 04/09/2016, Normal    HYDROcodone-acetaminophen (NORCO/VICODIN) 5-325 MG tablet Take 1 tablet by mouth every 6 (six) hours as needed for moderate pain., Starting Tue 04/09/2016, Print      Plan: 1. Test/x-ray results and diagnosis reviewed with patient 2. rx as per orders; risks, benefits, potential side effects reviewed with patient 3. Recommend supportive treatment with Rest and symptom avoidance. Patient does have a history of kidney stones and with the amount of RBCs in her urine today it is possible that she is passing the stone. Told her to observe her urine for possible stones. If she worsens she should go to emergency department. She is to follow-up with Dr. Gavin PottersGrandis next week. For her sciatica I have recommended that she curtail her rock hunting for the short-term . I believe that she is overdoing it. I provided her with a very few pain pills for the possible kidney stone and have told her to continue with her Mobic but not to add any Advil or Aleve to the regimen as the Mobic will be covering that. He tells me that she does take numerous OTC preparations for the pain told her the Mobic should be  sufficient.  4. F/u prn if symptoms worsen or don't improve     Lutricia FeilWilliam P Roemer, PA-C 04/09/16 1153

## 2016-04-09 NOTE — ED Triage Notes (Signed)
Patient has arrived with multiple complaints and is very agitated. Patient complaining mostly of right hip pain and high blood sugar.

## 2016-04-11 LAB — URINE CULTURE
Culture: NO GROWTH
Special Requests: NORMAL

## 2016-04-13 ENCOUNTER — Emergency Department
Admission: EM | Admit: 2016-04-13 | Discharge: 2016-04-13 | Disposition: A | Discharge status: Home / Self Care | Payer: Medicare Other | Attending: Emergency Medicine | Admitting: Emergency Medicine

## 2016-04-13 ENCOUNTER — Encounter: Payer: Self-pay | Admitting: Emergency Medicine

## 2016-04-13 DIAGNOSIS — M549 Dorsalgia, unspecified: Secondary | ICD-10-CM | POA: Diagnosis present

## 2016-04-13 DIAGNOSIS — Z7982 Long term (current) use of aspirin: Secondary | ICD-10-CM | POA: Diagnosis not present

## 2016-04-13 DIAGNOSIS — Z79899 Other long term (current) drug therapy: Secondary | ICD-10-CM | POA: Diagnosis not present

## 2016-04-13 DIAGNOSIS — M5431 Sciatica, right side: Secondary | ICD-10-CM

## 2016-04-13 DIAGNOSIS — R3121 Asymptomatic microscopic hematuria: Secondary | ICD-10-CM | POA: Insufficient documentation

## 2016-04-13 DIAGNOSIS — Z87442 Personal history of urinary calculi: Secondary | ICD-10-CM | POA: Diagnosis not present

## 2016-04-13 DIAGNOSIS — E119 Type 2 diabetes mellitus without complications: Secondary | ICD-10-CM | POA: Insufficient documentation

## 2016-04-13 DIAGNOSIS — Z7984 Long term (current) use of oral hypoglycemic drugs: Secondary | ICD-10-CM | POA: Insufficient documentation

## 2016-04-13 DIAGNOSIS — F17210 Nicotine dependence, cigarettes, uncomplicated: Secondary | ICD-10-CM | POA: Diagnosis not present

## 2016-04-13 DIAGNOSIS — R3129 Other microscopic hematuria: Secondary | ICD-10-CM

## 2016-04-13 LAB — URINALYSIS COMPLETE WITH MICROSCOPIC (ARMC ONLY)
Bilirubin Urine: NEGATIVE
Glucose, UA: NEGATIVE mg/dL
KETONES UR: NEGATIVE mg/dL
Nitrite: NEGATIVE
PH: 5 (ref 5.0–8.0)
PROTEIN: NEGATIVE mg/dL
SQUAMOUS EPITHELIAL / LPF: NONE SEEN
Specific Gravity, Urine: 1.018 (ref 1.005–1.030)

## 2016-04-13 MED ORDER — HYDROCODONE-ACETAMINOPHEN 5-325 MG PO TABS: 1.0000 | ORAL_TABLET | Freq: Four times a day (QID) | ORAL | 0 refills | Status: DC | PRN

## 2016-04-13 MED ORDER — KETOROLAC TROMETHAMINE 60 MG/2ML IM SOLN
30.0000 mg | Freq: Once | INTRAMUSCULAR | Status: AC
Start: 2016-04-13 — End: 2016-04-13
  Administered 2016-04-13: 30 mg via INTRAMUSCULAR

## 2016-04-13 MED ORDER — KETOROLAC TROMETHAMINE 30 MG/ML IJ SOLN
INTRAMUSCULAR | Status: AC
  Filled 2016-04-13: qty 1

## 2016-04-13 MED ORDER — KETOROLAC TROMETHAMINE 10 MG PO TABS
10.0000 mg | ORAL_TABLET | Freq: Three times a day (TID) | ORAL | 0 refills | Status: DC
Start: 2016-04-13 — End: 2017-12-22

## 2016-04-13 NOTE — ED Provider Notes (Signed)
[facility] Emergency Department Provider Note ____________________________________________  Time seen: 2015  I have reviewed the triage vital signs and the nursing notes.  HISTORY  Chief Complaint  Back Pain and Sciatica  HPI [name] is a 60 y.o. female presents to the ED after being evaluated at [facility] urgent care earlier this week with current treatment for a possibleUTI and stone passage. Patient presents today with multiple concerns related to her chronic and ongoing sciatica, chronic back pain, rotator cuff tendinopathy, and plantar fasciitis. She was treated with a small perception for pain medicine as well as a 10-day prescription for antibiotics and muscle relaxant. She presents now with multiple nonspecific complaints, reporting that she is unable to see her primary care provider until next week. Patient denies any interim injury, accident, trauma, fall. She is dosing the medications as prescribed. She notes that she has completed the pain medicine at this time.  Past Medical History:  Diagnosis Date  . Arthritis   . Barrett esophagus   . Breast mass 04/02/2011   mass removed from left negative  . Depression   . Diabetes mellitus without complication (HCC)   . GERD (gastroesophageal reflux disease)   . Thyroid disease     There are no active problems to display for this patient.   Past Surgical History:  Procedure Laterality Date  . BLADDER SUSPENSION  2009  . CESAREAN SECTION    . LAPAROSCOPIC SALPINGOOPHERECTOMY Left 1976  . MENISCECTOMY Bilateral 1980, 2000  . REPLACEMENT TOTAL KNEE Left 2010    Prior to Admission medications   Medication Sig Start Date End Date Taking? Authorizing Provider  aspirin EC 81 MG tablet Take 81 mg by mouth daily.    Historical Provider, MD  cephALEXin (KEFLEX) 500 MG capsule Take 1 capsule (500 mg total) by mouth 2 (two) times daily. 04/09/16   Lutricia Feil, PA-C  cetirizine (ZYRTEC) 10 MG tablet  Take 10 mg by mouth daily as needed. 02/01/15 04/09/16  Historical Provider, MD  clonazePAM (KLONOPIN) 0.5 MG tablet Take 0.5 mg by mouth daily as needed.    Historical Provider, MD  cyclobenzaprine (FLEXERIL) 10 MG tablet Take 1 tablet (10 mg total) by mouth 2 (two) times daily as needed for muscle spasms. 04/09/16   Lutricia Feil, PA-C  esomeprazole (NEXIUM) 20 MG capsule Take 1 capsule by mouth 2 (two) times daily. 02/22/15 04/09/16  Historical Provider, MD  HYDROcodone-acetaminophen (NORCO) 5-325 MG tablet Take 1 tablet by mouth every 6 (six) hours as needed. 04/13/16   Jerae Izard V Bacon Yoan Sallade, PA-C  ketorolac (TORADOL) 10 MG tablet Take 1 tablet (10 mg total) by mouth every 8 (eight) hours. 04/13/16   Philippe Gang V Bacon Shadonna Benedick, PA-C  levothyroxine (SYNTHROID, LEVOTHROID) 175 MCG tablet Take 175 mcg by mouth daily. 09/01/15 08/31/16  Historical Provider, MD  lisinopril (PRINIVIL,ZESTRIL) 2.5 MG tablet Take 2.5 mg by mouth daily. 09/01/15 08/31/16  Historical Provider, MD  meloxicam (MOBIC) 15 MG tablet Take 15 mg by mouth daily. 08/30/15 08/29/16  Historical Provider, MD  metFORMIN (GLUCOPHAGE-XR) 500 MG 24 hr tablet Take 2 tablets by mouth daily with supper. 02/22/15 04/09/16  Historical Provider, MD  sitaGLIPtin (JANUVIA) 100 MG tablet Take 100 mg by mouth daily.    Historical Provider, MD    Allergies Chloraseptic max sore throat  [phenol-glycerin]; Tramadol; and Haloperidol decanoate  Family History  Problem Relation Age of Onset  . Breast cancer Sister 70    half sister on fathers side  Social History Social History  Substance Use Topics  . Smoking status: Current Some Day Smoker    Years: 10.00    Types: Cigarettes  . Smokeless tobacco: Never Used     Comment: few cigarettes per week  . Alcohol use No    Review of Systems  Constitutional: Negative for fever. Eyes: Negative for visual changes. ENT: Negative for sore throat. Cardiovascular: Negative for chest pain. Respiratory:  Negative for shortness of breath. Gastrointestinal: Negative for abdominal pain, vomiting and diarrhea. Genitourinary: Negative for dysuria. Musculoskeletal: Negative for back pain. Skin: Negative for rash. Neurological: Negative for headaches, focal weakness or numbness. ____________________________________________  PHYSICAL EXAM:  VITAL SIGNS: ED Triage Vitals [04/13/16 1852]  Enc Vitals Group     BP (!) 157/85     Pulse Rate 76     Resp 20     Temp 98.6 F (37 C)     Temp Source Oral     SpO2 96 %     Weight 242 lb (109.8 kg)     Height 5' 7.5" (1.715 m)     Head Circumference      Peak Flow      Pain Score 7     Pain Loc      Pain Edu?      Excl. in GC?    Constitutional: Alert and oriented. Well appearing and in no distress. Head: Normocephalic and atraumatic. Cardiovascular: Normal rate, regular rhythm.  Respiratory: Normal respiratory effort. No wheezes/rales/rhonchi. Gastrointestinal: Soft and nontender. No distention, rebound, guarding, or rigidity. Mild right flank tenderness. Musculoskeletal: Spinal alignment without midline tenderness, spasm, deformity, or step-off. Nontender with normal range of motion in all extremities.  Neurologic: Normal LE DTRs bilaterally. Normal gait without ataxia. Normal speech and language. No gross focal neurologic deficits are appreciated. Skin:  Skin is warm, dry and intact. No rash noted. Psychiatric: Mood and affect are normal. Patient exhibits appropriate insight and judgment. ____________________________________________   LABS (pertinent positives/negatives) Labs Reviewed  URINALYSIS COMPLETEWITH MICROSCOPIC ([facility] ONLY) - Abnormal; Notable for the following:       Result Value   Color, Urine YELLOW (*)    APPearance CLEAR (*)    Hgb urine dipstick 2+ (*)    Leukocytes, UA TRACE (*)    Bacteria, UA RARE (*)    All other components within normal limits  ____________________________________________  PROCEDURES  Toradol  30 mg IM ____________________________________________  INITIAL IMPRESSION / ASSESSMENT AND PLAN / ED COURSE  Patient with chronic, stable sciatica, rotator cuff tendinopathy, and continued microscopic hematuria on current antibiotic regimen for potential kidney stone passage. She is advised to continue to dose antibiotic prescription as prescribed. She is also advised to continue with her muscle relaxant as directed. Be discharged with [place] for the next 5 days. She also is given a small prescription of hydrocodone for intermittent pain relief. The patient will follow with primary care provider next week as scheduled. Return to the ED as needed.  Clinical Course   ____________________________________________  FINAL CLINICAL IMPRESSION(S) / ED DIAGNOSES  Final diagnoses:  Microscopic hematuria  History of kidney stones  Sciatica of right side      Lissa HoardJenise V Bacon Kharizma Lesnick, PA-C 04/14/16 1700    Emily FilbertJonathan E Williams, MD 04/16/16 0700

## 2016-04-13 NOTE — ED Triage Notes (Signed)
[facility] earlier this week and was treated for UTI and possible kidney stone. Pt states she has chronic back pain and sciatica and was given medication for her pain but ran out of pain medication on Friday. Pt states she gets cortisone shots as well, but isn't helping with her chronic pain. Pt has multiple c/o's of pain

## 2016-04-13 NOTE — Discharge Instructions (Signed)
Your lab shows continued microscopic hematuria. You are likely passing a kidney stone. Continue to monitor and strain urine to confirm possible stone passage. Take the prescription anti-inflammatory in place of your daily Meloxicam, for the next 5 days. Take the pain medicine primarily at bedtime. Continue to dose the antibiotic as previously prescribed. Follow-up with Dr. Gavin PottersGrandis as scheduled on Tuesday.

## 2016-05-15 ENCOUNTER — Ambulatory Visit
Admission: RE | Admit: 2016-05-15 | Discharge: 2016-05-15 | Disposition: A | Discharge status: Home / Self Care | Payer: Medicare Other | Source: Ambulatory Visit | Attending: Family Medicine | Admitting: Family Medicine

## 2016-05-15 ENCOUNTER — Other Ambulatory Visit: Payer: Self-pay | Admitting: Family Medicine

## 2016-05-15 DIAGNOSIS — K760 Fatty (change of) liver, not elsewhere classified: Secondary | ICD-10-CM | POA: Diagnosis not present

## 2016-05-15 DIAGNOSIS — M546 Pain in thoracic spine: Secondary | ICD-10-CM | POA: Insufficient documentation

## 2016-05-15 DIAGNOSIS — K439 Ventral hernia without obstruction or gangrene: Secondary | ICD-10-CM | POA: Diagnosis not present

## 2016-05-15 DIAGNOSIS — M545 Low back pain: Secondary | ICD-10-CM

## 2016-05-15 DIAGNOSIS — R319 Hematuria, unspecified: Secondary | ICD-10-CM | POA: Insufficient documentation

## 2016-05-15 DIAGNOSIS — N200 Calculus of kidney: Secondary | ICD-10-CM | POA: Insufficient documentation

## 2016-06-18 ENCOUNTER — Other Ambulatory Visit: Payer: Self-pay | Admitting: Orthopedic Surgery

## 2016-06-18 DIAGNOSIS — M5116 Intervertebral disc disorders with radiculopathy, lumbar region: Secondary | ICD-10-CM

## 2016-06-28 ENCOUNTER — Ambulatory Visit
Admission: RE | Admit: 2016-06-28 | Discharge: 2016-06-28 | Disposition: A | Discharge status: Home / Self Care | Payer: Medicare Other | Source: Ambulatory Visit | Attending: Orthopedic Surgery | Admitting: Orthopedic Surgery

## 2016-06-28 DIAGNOSIS — M47896 Other spondylosis, lumbar region: Secondary | ICD-10-CM | POA: Insufficient documentation

## 2016-06-28 DIAGNOSIS — M5116 Intervertebral disc disorders with radiculopathy, lumbar region: Secondary | ICD-10-CM | POA: Insufficient documentation

## 2016-06-28 DIAGNOSIS — M47897 Other spondylosis, lumbosacral region: Secondary | ICD-10-CM | POA: Diagnosis not present

## 2016-10-19 ENCOUNTER — Emergency Department
Admission: EM | Admit: 2016-10-19 | Discharge: 2016-10-19 | Disposition: A | Discharge status: Home / Self Care | Payer: Medicare Other | Attending: Emergency Medicine | Admitting: Emergency Medicine

## 2016-10-19 ENCOUNTER — Emergency Department: Payer: Medicare Other

## 2016-10-19 DIAGNOSIS — Y999 Unspecified external cause status: Secondary | ICD-10-CM | POA: Diagnosis not present

## 2016-10-19 DIAGNOSIS — T07XXXA Unspecified multiple injuries, initial encounter: Secondary | ICD-10-CM

## 2016-10-19 DIAGNOSIS — S0990XA Unspecified injury of head, initial encounter: Secondary | ICD-10-CM | POA: Diagnosis present

## 2016-10-19 DIAGNOSIS — E119 Type 2 diabetes mellitus without complications: Secondary | ICD-10-CM | POA: Diagnosis not present

## 2016-10-19 DIAGNOSIS — Y9389 Activity, other specified: Secondary | ICD-10-CM | POA: Insufficient documentation

## 2016-10-19 DIAGNOSIS — R0789 Other chest pain: Secondary | ICD-10-CM | POA: Insufficient documentation

## 2016-10-19 DIAGNOSIS — Z79899 Other long term (current) drug therapy: Secondary | ICD-10-CM | POA: Diagnosis not present

## 2016-10-19 DIAGNOSIS — S2020XA Contusion of thorax, unspecified, initial encounter: Secondary | ICD-10-CM

## 2016-10-19 DIAGNOSIS — S0081XA Abrasion of other part of head, initial encounter: Secondary | ICD-10-CM | POA: Diagnosis not present

## 2016-10-19 DIAGNOSIS — Y92410 Unspecified street and highway as the place of occurrence of the external cause: Secondary | ICD-10-CM | POA: Diagnosis not present

## 2016-10-19 DIAGNOSIS — Z7982 Long term (current) use of aspirin: Secondary | ICD-10-CM | POA: Insufficient documentation

## 2016-10-19 DIAGNOSIS — Z7984 Long term (current) use of oral hypoglycemic drugs: Secondary | ICD-10-CM | POA: Diagnosis not present

## 2016-10-19 DIAGNOSIS — F17210 Nicotine dependence, cigarettes, uncomplicated: Secondary | ICD-10-CM | POA: Insufficient documentation

## 2016-10-19 DIAGNOSIS — R1084 Generalized abdominal pain: Secondary | ICD-10-CM | POA: Insufficient documentation

## 2016-10-19 DIAGNOSIS — M7918 Myalgia, other site: Secondary | ICD-10-CM

## 2016-10-19 DIAGNOSIS — S20211A Contusion of right front wall of thorax, initial encounter: Secondary | ICD-10-CM | POA: Insufficient documentation

## 2016-10-19 MED ORDER — IOPAMIDOL (ISOVUE-300) INJECTION 61%
100.0000 mL | Freq: Once | INTRAVENOUS | Status: AC | PRN
  Administered 2016-10-19: 100 mL via INTRAVENOUS
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
## 2016-10-19 NOTE — Discharge Instructions (Signed)
Return to the emergency department immediately for any worsening condition clinic trouble breathing, weakness, numbness, chest pain, or any other symptoms concerning to you.
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
## 2016-10-27 ENCOUNTER — Encounter: Payer: Self-pay | Admitting: Gynecology

## 2016-10-27 ENCOUNTER — Ambulatory Visit
Admission: EM | Admit: 2016-10-27 | Discharge: 2016-10-27 | Disposition: A | Discharge status: Home / Self Care | Payer: Medicare Other | Attending: Family Medicine | Admitting: Family Medicine

## 2016-10-27 DIAGNOSIS — L509 Urticaria, unspecified: Secondary | ICD-10-CM | POA: Diagnosis not present

## 2016-10-27 MED ORDER — PREDNISONE 10 MG (21) PO TBPK: ORAL_TABLET | Freq: Every day | ORAL | 0 refills | Status: DC

## 2016-10-27 MED ORDER — HYDROXYZINE HCL 25 MG PO TABS: 25.0000 mg | ORAL_TABLET | Freq: Three times a day (TID) | ORAL | 0 refills | Status: DC | PRN

## 2016-10-27 MED ORDER — METHYLPREDNISOLONE SODIUM SUCC 125 MG IJ SOLR
40.0000 mg | Freq: Once | INTRAMUSCULAR | Status: AC
  Administered 2016-10-27: 40 mg via INTRAMUSCULAR

## 2016-10-27 NOTE — ED Provider Notes (Signed)
[facility]    CSN: [id number] Arrival date & time: 10/27/16  1323  History   Chief Complaint Chief Complaint  Patient presents with  . Urticaria  . Allergic Reaction   HPI  61 year old female presents with the above complaints.  Patient states that on Saturday morning she awoke and had a small area of rash. It has now progressed and is located diffusely. They are red and raised and appear like hives. They are very pruritic. She has had several recent exposures/changes. She had a vaccination last week of hepatitis A and hepatitis B. She was recently given Tylenol with codeine this past Thursday regarding her ongoing knee pain status post total knee replacement. She has also been out of town recently for a wedding. She is very bothered by her rash. Her itching has not improved with Benadryl. No associated fever. No other associated symptoms or complaints at this time.  Past Medical History:  Diagnosis Date  . Arthritis   . Barrett esophagus   . Breast mass 04/02/2011   mass removed from left negative  . Depression   . Diabetes mellitus without complication (HCC)   . GERD (gastroesophageal reflux disease)   . Thyroid disease    Past Surgical History:  Procedure Laterality Date  . BLADDER SUSPENSION  2009  . CESAREAN SECTION    . LAPAROSCOPIC SALPINGOOPHERECTOMY Left 1976  . MENISCECTOMY Bilateral 1980, 2000  . REPLACEMENT TOTAL KNEE Left 2010   OB History    No data available     Home Medications    Prior to Admission medications   Medication Sig Start Date End Date Taking? Authorizing Provider  aspirin EC 81 MG tablet Take 81 mg by mouth daily.   Yes Historical Provider, MD  cephALEXin (KEFLEX) 500 MG capsule Take 1 capsule (500 mg total) by mouth 2 (two) times daily. 04/09/16  Yes Lutricia Feil, PA-C  clonazePAM (KLONOPIN) 0.5 MG tablet Take 0.5 mg by mouth daily as needed.   Yes Historical Provider, MD  cyclobenzaprine (FLEXERIL) 10 MG tablet Take 1  tablet (10 mg total) by mouth 2 (two) times daily as needed for muscle spasms. 04/09/16  Yes Lutricia Feil, PA-C  HYDROcodone-acetaminophen (NORCO) 5-325 MG tablet Take 1 tablet by mouth every 6 (six) hours as needed. 04/13/16  Yes Jenise V Bacon Menshew, PA-C  ketorolac (TORADOL) 10 MG tablet Take 1 tablet (10 mg total) by mouth every 8 (eight) hours. 04/13/16  Yes Jenise V Bacon Menshew, PA-C  cetirizine (ZYRTEC) 10 MG tablet Take 10 mg by mouth daily as needed. 02/01/15 04/09/16  Historical Provider, MD  esomeprazole (NEXIUM) 20 MG capsule Take 1 capsule by mouth 2 (two) times daily. 02/22/15 04/09/16  Historical Provider, MD  hydrOXYzine (ATARAX/VISTARIL) 25 MG tablet Take 1 tablet (25 mg total) by mouth every 8 (eight) hours as needed. 10/27/16   Tommie Sams, DO  levothyroxine (SYNTHROID, LEVOTHROID) 175 MCG tablet Take 175 mcg by mouth daily. 09/01/15 08/31/16  Historical Provider, MD  lisinopril (PRINIVIL,ZESTRIL) 2.5 MG tablet Take 2.5 mg by mouth daily. 09/01/15 08/31/16  Historical Provider, MD  metFORMIN (GLUCOPHAGE-XR) 500 MG 24 hr tablet Take 2 tablets by mouth daily with supper. 02/22/15 04/09/16  Historical Provider, MD  predniSONE (STERAPRED UNI-PAK 21 TAB) 10 MG (21) TBPK tablet Take by mouth daily. 6 tablets on day 1 then decrease to by 1 tablet daily until gone. 10/27/16   Kymari Lollis G Munachimso Rigdon, DO  sitaGLIPtin (JANUVIA) 100 MG tablet Take 100 mg  by mouth daily.    Historical Provider, MD   Family History Family History  Problem Relation Age of Onset  . Breast cancer Sister [id number]    half sister on fathers side   Social History Social History  Substance Use Topics  . Smoking status: Current Some Day Smoker    Years: 10.00    Types: Cigarettes  . Smokeless tobacco: Never Used     Comment: few cigarettes per week  . Alcohol use No     Allergies   Chloraseptic max sore throat  [phenol-glycerin]; Tramadol; and Haloperidol decanoate   Review of Systems Review of Systems  Constitutional:  Negative.   Skin: Positive for rash.   Physical Exam Triage Vital Signs ED Triage Vitals  Enc Vitals Group     BP 10/27/16 1346 (!) 142/77     Pulse Rate 10/27/16 1346 90     Resp 10/27/16 1346 16     Temp 10/27/16 1346 98.2 F (36.8 C)     Temp Source 10/27/16 1346 Oral     SpO2 10/27/16 1346 100 %     Weight 10/27/16 1350 242 lb (109.8 kg)     Height --      Head Circumference --      Peak Flow --      Pain Score 10/27/16 1351 3     Pain Loc --      Pain Edu? --      Excl. in GC? --    Updated Vital Signs BP (!) 142/77 (BP Location: Left Arm)   Pulse 90   Temp 98.2 F (36.8 C) (Oral)   Resp 16   Wt 242 lb (109.8 kg)   SpO2 100%   BMI 37.90 kg/m   Physical Exam  Constitutional: She appears well-developed. No distress.  Cardiovascular: Normal rate and regular rhythm.   Pulmonary/Chest: Effort normal and breath sounds normal.  Skin:  Diffuse raised erythematous rash.  Psychiatric: She has a normal mood and affect.  Vitals reviewed.  UC Treatments / Results  Labs (all labs ordered are listed, but only abnormal results are displayed) Labs Reviewed - No data to display  EKG  EKG Interpretation None       Radiology No results found.  Procedures Procedures (including critical care time)  Medications Ordered in UC Medications  methylPREDNISolone sodium succinate (SOLU-MEDROL) 125 mg/2 mL injection 40 mg (40 mg Intramuscular Given 10/27/16 1431)    Initial Impression / Assessment and Plan / UC Course  I have reviewed the triage vital signs and the nursing notes.  Pertinent labs & imaging results that were available during my care of the patient were reviewed by me and considered in my medical decision making (see chart for details).    61 year old female presents with urticaria. Etiology unclear at this time although I do favor recent addition of Tylenol with codeine. I told her to refrain from this medication. Treating with Atarax and prednisone. IM  Solu-Medrol injection given today.  Final Clinical Impressions(s) / UC Diagnoses   Final diagnoses:  Urticaria    New Prescriptions Discharge Medication List as of 10/27/2016  2:30 PM    START taking these medications   Details  hydrOXYzine (ATARAX/VISTARIL) 25 MG tablet Take 1 tablet (25 mg total) by mouth every 8 (eight) hours as needed., Starting Sun 10/27/2016, Normal    predniSONE (STERAPRED UNI-PAK 21 TAB) 10 MG (21) TBPK tablet Take by mouth daily. 6 tablets on day 1 then decrease to by 1  tablet daily until gone., Starting Sun 10/27/2016, Normal         Tommie Sams, DO 10/27/16 1455

## 2016-10-27 NOTE — Discharge Instructions (Signed)
Steroid as prescribed.  Atarax as needed.  Take care  Dr. Adriana Simasook

## 2016-10-27 NOTE — ED Triage Notes (Signed)
Per patient c/o hives. Per patient notice hives x yesterday. Patient not sure if new medication given Friday from her knee doctor or bed bugs.

## 2017-03-25 ENCOUNTER — Other Ambulatory Visit: Payer: Self-pay | Admitting: Family Medicine

## 2017-03-25 DIAGNOSIS — Z1239 Encounter for other screening for malignant neoplasm of breast: Secondary | ICD-10-CM

## 2017-04-08 ENCOUNTER — Inpatient Hospital Stay: Admission: RE | Admit: 2017-04-08 | Payer: Medicare Other | Source: Ambulatory Visit

## 2017-05-24 ENCOUNTER — Ambulatory Visit
Admission: EM | Admit: 2017-05-24 | Discharge: 2017-05-24 | Disposition: A | Discharge status: Home / Self Care | Payer: No Typology Code available for payment source | Attending: Emergency Medicine | Admitting: Emergency Medicine

## 2017-05-24 ENCOUNTER — Encounter: Payer: Self-pay | Admitting: Emergency Medicine

## 2017-05-24 ENCOUNTER — Ambulatory Visit: Payer: No Typology Code available for payment source

## 2017-05-24 DIAGNOSIS — F329 Major depressive disorder, single episode, unspecified: Secondary | ICD-10-CM | POA: Insufficient documentation

## 2017-05-24 DIAGNOSIS — M545 Low back pain: Secondary | ICD-10-CM | POA: Diagnosis present

## 2017-05-24 DIAGNOSIS — Y92410 Unspecified street and highway as the place of occurrence of the external cause: Secondary | ICD-10-CM | POA: Diagnosis not present

## 2017-05-24 DIAGNOSIS — S39012A Strain of muscle, fascia and tendon of lower back, initial encounter: Secondary | ICD-10-CM | POA: Diagnosis not present

## 2017-05-24 DIAGNOSIS — Z7982 Long term (current) use of aspirin: Secondary | ICD-10-CM | POA: Insufficient documentation

## 2017-05-24 DIAGNOSIS — Z7984 Long term (current) use of oral hypoglycemic drugs: Secondary | ICD-10-CM | POA: Diagnosis not present

## 2017-05-24 DIAGNOSIS — E079 Disorder of thyroid, unspecified: Secondary | ICD-10-CM | POA: Diagnosis not present

## 2017-05-24 DIAGNOSIS — F17210 Nicotine dependence, cigarettes, uncomplicated: Secondary | ICD-10-CM | POA: Insufficient documentation

## 2017-05-24 DIAGNOSIS — S161XXA Strain of muscle, fascia and tendon at neck level, initial encounter: Secondary | ICD-10-CM | POA: Diagnosis not present

## 2017-05-24 DIAGNOSIS — Z79899 Other long term (current) drug therapy: Secondary | ICD-10-CM | POA: Insufficient documentation

## 2017-05-24 DIAGNOSIS — K219 Gastro-esophageal reflux disease without esophagitis: Secondary | ICD-10-CM | POA: Diagnosis not present

## 2017-05-24 DIAGNOSIS — E119 Type 2 diabetes mellitus without complications: Secondary | ICD-10-CM | POA: Diagnosis not present
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
## 2017-05-24 NOTE — Discharge Instructions (Signed)
Your xrays were negative. Please go home and take your muscle relaxers and BP meds as soon as you get home.
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
## 2017-06-03 ENCOUNTER — Encounter: Admission: RE | Payer: Self-pay | Source: Ambulatory Visit

## 2017-06-03 ENCOUNTER — Ambulatory Visit: Admission: RE | Admit: 2017-06-03 | Payer: Medicare Other | Source: Ambulatory Visit | Admitting: Gastroenterology

## 2017-06-03 SURGERY — ESOPHAGOGASTRODUODENOSCOPY (EGD) WITH PROPOFOL
Anesthesia: General

## 2017-10-09 ENCOUNTER — Ambulatory Visit
Admission: RE | Admit: 2017-10-09 | Discharge: 2017-10-09 | Disposition: A | Discharge status: Home / Self Care | Payer: Medicare Other | Source: Ambulatory Visit | Attending: Family Medicine | Admitting: Family Medicine

## 2017-10-09 DIAGNOSIS — Z1239 Encounter for other screening for malignant neoplasm of breast: Secondary | ICD-10-CM

## 2017-10-09 DIAGNOSIS — Z1231 Encounter for screening mammogram for malignant neoplasm of breast: Secondary | ICD-10-CM | POA: Insufficient documentation

## 2017-12-01 ENCOUNTER — Other Ambulatory Visit: Payer: Self-pay

## 2017-12-01 ENCOUNTER — Encounter: Payer: Self-pay | Admitting: Emergency Medicine

## 2017-12-01 ENCOUNTER — Emergency Department: Payer: Medicare Other

## 2017-12-01 ENCOUNTER — Emergency Department
Admission: EM | Admit: 2017-12-01 | Discharge: 2017-12-01 | Disposition: A | Discharge status: Home / Self Care | Payer: Medicare Other | Attending: Emergency Medicine | Admitting: Emergency Medicine

## 2017-12-01 DIAGNOSIS — F329 Major depressive disorder, single episode, unspecified: Secondary | ICD-10-CM | POA: Diagnosis not present

## 2017-12-01 DIAGNOSIS — J209 Acute bronchitis, unspecified: Secondary | ICD-10-CM

## 2017-12-01 DIAGNOSIS — Z96652 Presence of left artificial knee joint: Secondary | ICD-10-CM | POA: Diagnosis not present

## 2017-12-01 DIAGNOSIS — Z79899 Other long term (current) drug therapy: Secondary | ICD-10-CM | POA: Insufficient documentation

## 2017-12-01 DIAGNOSIS — Z7984 Long term (current) use of oral hypoglycemic drugs: Secondary | ICD-10-CM | POA: Insufficient documentation

## 2017-12-01 DIAGNOSIS — F17210 Nicotine dependence, cigarettes, uncomplicated: Secondary | ICD-10-CM | POA: Diagnosis not present

## 2017-12-01 DIAGNOSIS — R0602 Shortness of breath: Secondary | ICD-10-CM | POA: Diagnosis present

## 2017-12-01 DIAGNOSIS — E119 Type 2 diabetes mellitus without complications: Secondary | ICD-10-CM | POA: Diagnosis not present

## 2017-12-01 LAB — TROPONIN I: Troponin I: <0.03 ng/mL (ref <0.03)

## 2017-12-01 LAB — CBC
HCT: 37.1 % (ref 35.0–47.0)
Hemoglobin: 12.6 g/dL (ref 12.0–16.0)
MCH: 30 pg (ref 26.0–34.0)
MCHC: 34 g/dL (ref 32.0–36.0)
MCV: 88.3 fL (ref 80.0–100.0)
PLATELETS: 285 10*3/uL (ref 150–440)
RBC: 4.2 MIL/uL (ref 3.80–5.20)
RDW: 14.4 % (ref 11.5–14.5)
WBC: 8.9 10*3/uL (ref 3.6–11.0)

## 2017-12-01 LAB — HEPATIC FUNCTION PANEL
ALT: 31 U/L (ref 14–54)
AST: 35 U/L (ref 15–41)
Albumin: 4.5 g/dL (ref 3.5–5.0)
Alkaline Phosphatase: 95 U/L (ref 38–126)
BILIRUBIN INDIRECT: 0.5 mg/dL (ref 0.3–0.9)
BILIRUBIN TOTAL: 0.6 mg/dL (ref 0.3–1.2)
Bilirubin, Direct: 0.1 mg/dL (ref 0.1–0.5)
TOTAL PROTEIN: 7.8 g/dL (ref 6.5–8.1)

## 2017-12-01 LAB — LIPASE, BLOOD: Lipase: 27 U/L (ref 11–51)

## 2017-12-01 LAB — BASIC METABOLIC PANEL
Anion gap: 10 (ref 5–15)
BUN: 19 mg/dL (ref 6–20)
CALCIUM: 9.2 mg/dL (ref 8.9–10.3)
CHLORIDE: 103 mmol/L (ref 101–111)
CO2: 24 mmol/L (ref 22–32)
CREATININE: 0.76 mg/dL (ref 0.44–1.00)
GFR calc Af Amer: >60 mL/min (ref >60)
GFR calc non Af Amer: >60 mL/min (ref >60)
Glucose, Bld: 116 mg/dL — ABNORMAL HIGH (ref 65–99)
Potassium: 3.7 mmol/L (ref 3.5–5.1)
Sodium: 137 mmol/L (ref 135–145)

## 2017-12-01 MED ORDER — ALBUTEROL SULFATE (2.5 MG/3ML) 0.083% IN NEBU
2.5000 mg | INHALATION_SOLUTION | Freq: Once | RESPIRATORY_TRACT | Status: AC
Start: 2017-12-01 — End: 2017-12-01
  Administered 2017-12-01: 2.5 mg via RESPIRATORY_TRACT
  Filled 2017-12-01: qty 3

## 2017-12-01 MED ORDER — PREDNISONE 20 MG PO TABS: 60.0000 mg | ORAL_TABLET | Freq: Every day | ORAL | 0 refills | Status: AC

## 2017-12-01 MED ORDER — IPRATROPIUM-ALBUTEROL 0.5-2.5 (3) MG/3ML IN SOLN
3.0000 mL | Freq: Once | RESPIRATORY_TRACT | Status: AC
  Administered 2017-12-01: 3 mL via RESPIRATORY_TRACT
  Filled 2017-12-01: qty 3

## 2017-12-01 MED ORDER — ALBUTEROL SULFATE HFA 108 (90 BASE) MCG/ACT IN AERS: 2.0000 | INHALATION_SPRAY | Freq: Four times a day (QID) | RESPIRATORY_TRACT | 0 refills | Status: AC | PRN

## 2017-12-01 MED ORDER — FAMOTIDINE IN NACL 20-0.9 MG/50ML-% IV SOLN
20.0000 mg | Freq: Once | INTRAVENOUS | Status: AC
  Administered 2017-12-01: 20 mg via INTRAVENOUS
  Filled 2017-12-01: qty 50

## 2017-12-01 MED ORDER — PREDNISONE 20 MG PO TABS
60.0000 mg | ORAL_TABLET | Freq: Once | ORAL | Status: AC
  Administered 2017-12-01: 60 mg via ORAL
  Filled 2017-12-01: qty 3
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
## 2017-12-01 NOTE — ED Notes (Signed)
Pt discharged home after verbalizing understanding of discharge instructions; nad noted. 

## 2017-12-01 NOTE — Discharge Instructions (Addendum)
Take the prednisone as prescribed starting tomorrow and finish the full course.  Use the inhaler as needed every 4-6 hours for the next several days for shortness of breath or wheezing.  Follow-up with your doctor within the next week to make sure that you are getting better, reassess how your blood pressure is doing, and decide if you need any changes in your medication.    Return to the ER for new, worsening, or persistent shortness of breath, dizziness or weakness, high fevers, chest pain, vomiting, or any other new or worsening symptoms that concern you.

## 2017-12-01 NOTE — ED Triage Notes (Signed)
[facility]. Pt went in for sob, chest tightness, dizziness. Has had cough x 1 week and experiences abdominal pain when she coughs due to hernia. Pt alert & oriented with NAD noted.

## 2017-12-22 ENCOUNTER — Encounter: Payer: Self-pay | Admitting: *Deleted

## 2017-12-23 ENCOUNTER — Ambulatory Visit: Payer: Medicare Other | Admitting: Anesthesiology

## 2017-12-23 ENCOUNTER — Ambulatory Visit
Admission: RE | Admit: 2017-12-23 | Discharge: 2017-12-23 | Disposition: A | Discharge status: Home / Self Care | Payer: Medicare Other | Source: Ambulatory Visit | Attending: Gastroenterology | Admitting: Gastroenterology

## 2017-12-23 ENCOUNTER — Encounter: Payer: Self-pay | Admitting: Anesthesiology

## 2017-12-23 ENCOUNTER — Encounter
Admission: RE | Disposition: A | Discharge status: Home / Self Care | Payer: Self-pay | Source: Ambulatory Visit | Attending: Gastroenterology

## 2017-12-23 DIAGNOSIS — F319 Bipolar disorder, unspecified: Secondary | ICD-10-CM | POA: Insufficient documentation

## 2017-12-23 DIAGNOSIS — Z09 Encounter for follow-up examination after completed treatment for conditions other than malignant neoplasm: Secondary | ICD-10-CM | POA: Insufficient documentation

## 2017-12-23 DIAGNOSIS — E079 Disorder of thyroid, unspecified: Secondary | ICD-10-CM | POA: Diagnosis not present

## 2017-12-23 DIAGNOSIS — B9681 Helicobacter pylori [H. pylori] as the cause of diseases classified elsewhere: Secondary | ICD-10-CM | POA: Insufficient documentation

## 2017-12-23 DIAGNOSIS — E119 Type 2 diabetes mellitus without complications: Secondary | ICD-10-CM | POA: Diagnosis not present

## 2017-12-23 DIAGNOSIS — Z8371 Family history of colonic polyps: Secondary | ICD-10-CM | POA: Insufficient documentation

## 2017-12-23 DIAGNOSIS — K228 Other specified diseases of esophagus: Secondary | ICD-10-CM | POA: Diagnosis not present

## 2017-12-23 DIAGNOSIS — Z79899 Other long term (current) drug therapy: Secondary | ICD-10-CM | POA: Insufficient documentation

## 2017-12-23 DIAGNOSIS — K644 Residual hemorrhoidal skin tags: Secondary | ICD-10-CM | POA: Diagnosis not present

## 2017-12-23 DIAGNOSIS — Z800 Family history of malignant neoplasm of digestive organs: Secondary | ICD-10-CM | POA: Diagnosis not present

## 2017-12-23 DIAGNOSIS — Z6839 Body mass index (BMI) 39.0-39.9, adult: Secondary | ICD-10-CM | POA: Diagnosis not present

## 2017-12-23 DIAGNOSIS — Z79890 Hormone replacement therapy (postmenopausal): Secondary | ICD-10-CM | POA: Insufficient documentation

## 2017-12-23 DIAGNOSIS — K2270 Barrett's esophagus without dysplasia: Secondary | ICD-10-CM | POA: Diagnosis not present

## 2017-12-23 DIAGNOSIS — K5730 Diverticulosis of large intestine without perforation or abscess without bleeding: Secondary | ICD-10-CM | POA: Insufficient documentation

## 2017-12-23 DIAGNOSIS — K2950 Unspecified chronic gastritis without bleeding: Secondary | ICD-10-CM | POA: Diagnosis not present

## 2017-12-23 DIAGNOSIS — Z7984 Long term (current) use of oral hypoglycemic drugs: Secondary | ICD-10-CM | POA: Diagnosis not present

## 2017-12-23 DIAGNOSIS — F419 Anxiety disorder, unspecified: Secondary | ICD-10-CM | POA: Diagnosis not present

## 2017-12-23 DIAGNOSIS — K219 Gastro-esophageal reflux disease without esophagitis: Secondary | ICD-10-CM | POA: Insufficient documentation

## 2017-12-23 DIAGNOSIS — Z86010 Personal history of colonic polyps: Secondary | ICD-10-CM | POA: Insufficient documentation

## 2017-12-23 DIAGNOSIS — Z1211 Encounter for screening for malignant neoplasm of colon: Secondary | ICD-10-CM | POA: Diagnosis present

## 2017-12-23 HISTORY — DX: Anxiety disorder, unspecified: F41.9

## 2017-12-23 HISTORY — PX: ESOPHAGOGASTRODUODENOSCOPY (EGD) WITH PROPOFOL: SHX5813

## 2017-12-23 HISTORY — PX: COLONOSCOPY WITH PROPOFOL: SHX5780

## 2017-12-23 HISTORY — DX: Barrett's esophagus without dysplasia: K22.70

## 2017-12-23 HISTORY — DX: Bipolar disorder, unspecified: F31.9

## 2017-12-23 LAB — GLUCOSE, CAPILLARY: GLUCOSE-CAPILLARY: 122 mg/dL — AB (ref 65–99)

## 2017-12-23 SURGERY — COLONOSCOPY WITH PROPOFOL
Anesthesia: General

## 2017-12-23 MED ORDER — SODIUM CHLORIDE 0.9 % IV SOLN
INTRAVENOUS | Status: DC
  Administered 2017-12-23: 1000 mL via INTRAVENOUS

## 2017-12-23 MED ORDER — FENTANYL CITRATE (PF) 100 MCG/2ML IJ SOLN
INTRAMUSCULAR | Status: DC | PRN
  Administered 2017-12-23: 50 ug via INTRAVENOUS
  Administered 2017-12-23 (×2): 25 ug via INTRAVENOUS

## 2017-12-23 MED ORDER — LIDOCAINE HCL (CARDIAC) PF 100 MG/5ML IV SOSY
PREFILLED_SYRINGE | INTRAVENOUS | Status: DC | PRN
  Administered 2017-12-23: 50 mg via INTRAVENOUS

## 2017-12-23 MED ORDER — LIDOCAINE HCL (PF) 2 % IJ SOLN
INTRAMUSCULAR | Status: AC
  Filled 2017-12-23: qty 10

## 2017-12-23 MED ORDER — MIDAZOLAM HCL 2 MG/2ML IJ SOLN
INTRAMUSCULAR | Status: DC | PRN
  Administered 2017-12-23: 2 mg via INTRAVENOUS

## 2017-12-23 MED ORDER — MIDAZOLAM HCL 2 MG/2ML IJ SOLN
INTRAMUSCULAR | Status: AC
  Filled 2017-12-23: qty 2

## 2017-12-23 MED ORDER — EPHEDRINE SULFATE 50 MG/ML IJ SOLN
INTRAMUSCULAR | Status: AC
  Filled 2017-12-23: qty 1

## 2017-12-23 MED ORDER — ALBUTEROL SULFATE HFA 108 (90 BASE) MCG/ACT IN AERS
INHALATION_SPRAY | RESPIRATORY_TRACT | Status: DC | PRN
  Administered 2017-12-23 (×2): 2 via RESPIRATORY_TRACT

## 2017-12-23 MED ORDER — PROPOFOL 500 MG/50ML IV EMUL
INTRAVENOUS | Status: AC
  Filled 2017-12-23: qty 50

## 2017-12-23 MED ORDER — IPRATROPIUM-ALBUTEROL 0.5-2.5 (3) MG/3ML IN SOLN: 3.0000 mL | Freq: Four times a day (QID) | RESPIRATORY_TRACT | Status: DC

## 2017-12-23 MED ORDER — IPRATROPIUM-ALBUTEROL 0.5-2.5 (3) MG/3ML IN SOLN
RESPIRATORY_TRACT | Status: AC
  Administered 2017-12-23: 3 mL
  Filled 2017-12-23: qty 3

## 2017-12-23 MED ORDER — ALBUTEROL SULFATE HFA 108 (90 BASE) MCG/ACT IN AERS
INHALATION_SPRAY | RESPIRATORY_TRACT | Status: AC
  Filled 2017-12-23: qty 6.7

## 2017-12-23 MED ORDER — LIDOCAINE HCL (PF) 1 % IJ SOLN
INTRAMUSCULAR | Status: AC
  Filled 2017-12-23: qty 2

## 2017-12-23 MED ORDER — FENTANYL CITRATE (PF) 100 MCG/2ML IJ SOLN
INTRAMUSCULAR | Status: AC
  Filled 2017-12-23: qty 2

## 2017-12-23 MED ORDER — BUTAMBEN-TETRACAINE-BENZOCAINE 2-2-14 % EX AERO
INHALATION_SPRAY | CUTANEOUS | Status: AC
  Filled 2017-12-23: qty 5

## 2017-12-23 MED ORDER — EPHEDRINE SULFATE 50 MG/ML IJ SOLN
INTRAMUSCULAR | Status: DC | PRN
  Administered 2017-12-23: 10 mg via INTRAVENOUS

## 2017-12-23 MED ORDER — PROPOFOL 500 MG/50ML IV EMUL
INTRAVENOUS | Status: DC | PRN
  Administered 2017-12-23: 140 ug/kg/min via INTRAVENOUS

## 2017-12-23 NOTE — Op Note (Signed)
[facility] Gastroenterology Patient Name: [name] Procedure Date: 12/23/2017 9:29 AM MRN: [id number] Account #: [id number] Date of Birth: Oct 24, 1955 Admit Type: Outpatient Age: 62 Room: [facility] ENDO ROOM 3 Gender: Female Note Status: Finalized Procedure:            Upper GI endoscopy Indications:          Follow-up of Barrett's esophagus Providers:            Christena Deem, MD Referring MD:         Letitia Caul MD, MD (Referring MD) Medicines:            Monitored Anesthesia Care Complications:        No immediate complications. Procedure:            Pre-Anesthesia Assessment:                       - ASA Grade Assessment: III - A patient with severe                        systemic disease.                       After obtaining informed consent, the endoscope was                        passed under direct vision. Throughout the procedure,                        the patient's blood pressure, pulse, and oxygen                        saturations were monitored continuously. The Endoscope                        was introduced through the mouth, and advanced to the                        third part of duodenum. The upper GI endoscopy was                        accomplished without difficulty. The patient tolerated                        the procedure well. Findings:      The Z-line was variable and was found 38 cm from the incisors.      There were esophageal mucosal changes secondary to established       short-segment Barrett's disease present at the gastroesophageal       junction. The maximum longitudinal extent of these mucosal changes was 1       cm in length. Mucosa was biopsied with a cold forceps for histology in 4       quadrants at 38 cm from the incisors. One specimen bottle was sent to       pathology.      A single 5 mm mucosal nodule with a localized distribution was found at       the gastroesophageal junction, 38 cm from the incisors.  Biopsies were       taken with a cold forceps for histology. Multiple biopsies with       essentially removal  of the nodule.      Diffuse mild inflammation characterized by congestion (edema) and       erythema was found in the gastric body. Biopsies were taken with a cold       forceps for histology. Biopsies were taken with a cold forceps for       Helicobacter pylori testing.      Patchy mild inflammation characterized by erosions and erythema was       found in the gastric antrum. Biopsies were taken with a cold forceps for       histology. Biopsies were taken with a cold forceps for Helicobacter       pylori testing.      The cardia and gastric fundus were normal on retroflexion.      Patchy mild mucosal variance characterized by smoothness was found in       the entire duodenum. Biopsies were taken with a cold forceps for       histology. Impression:           - Z-line variable, 38 cm from the incisors.                       - Esophageal mucosal changes secondary to established                        short-segment Barrett's disease. Biopsied.                       - Mucosal nodule found in the esophagus. Biopsied.                       - Gastritis. Biopsied.                       - Gastritis. Biopsied.                       - Mucosal variant in the duodenum. Biopsied. Recommendation:       - Depending on result of esophageal nodule biopsy, may                        need to have upper EUS. Procedure Code(s):    --- Professional ---                       [phone], Esophagogastroduodenoscopy, flexible, transoral;                        with biopsy, single or multiple Diagnosis Code(s):    --- Professional ---                       K22.8, Other specified diseases of esophagus                       K22.70, Barrett's esophagus without dysplasia                       K29.70, Gastritis, unspecified, without bleeding                       K31.89, Other diseases of stomach and duodenum CPT  copyright 2017 American Medical Association. All rights reserved. The codes documented in this report are preliminary and upon  coder review may  be revised to meet current compliance requirements. Christena Deem, MD 12/23/2017 10:10:43 AM This report has been signed electronically. Number of Addenda: 0 Note Initiated On: 12/23/2017 9:29 AM      [facility]

## 2017-12-23 NOTE — Anesthesia Postprocedure Evaluation (Signed)
Anesthesia Post Note  Patient: [name]  Procedure(s) Performed: COLONOSCOPY WITH PROPOFOL (N/A ) ESOPHAGOGASTRODUODENOSCOPY (EGD) WITH PROPOFOL (N/A )  Patient location during evaluation: Endoscopy Anesthesia Type: General Level of consciousness: awake and alert Pain management: pain level controlled Vital Signs Assessment: post-procedure vital signs reviewed and stable Respiratory status: spontaneous breathing, nonlabored ventilation, respiratory function stable and patient connected to nasal cannula oxygen Cardiovascular status: blood pressure returned to baseline and stable Postop Assessment: no apparent nausea or vomiting Anesthetic complications: no     Last Vitals:  Vitals:   12/23/17 1046 12/23/17 1100  BP: 108/67 134/66  Pulse:    Resp:    Temp: (!) 36.2 C   SpO2:      Last Pain:  Vitals:   12/23/17 1130  TempSrc:   PainSc: 0-No pain                 Lenard Simmer

## 2017-12-23 NOTE — Transfer of Care (Signed)
Immediate Anesthesia Transfer of Care Note  Patient: [name]  Procedure(s) Performed: COLONOSCOPY WITH PROPOFOL (N/A ) ESOPHAGOGASTRODUODENOSCOPY (EGD) WITH PROPOFOL (N/A )  Patient Location: PACU  Anesthesia Type:General  Level of Consciousness: awake and sedated  Airway & Oxygen Therapy: Patient Spontanous Breathing and Patient connected to face mask oxygen  Post-op Assessment: Report given to RN and Post -op Vital signs reviewed and stable  Post vital signs: Reviewed and stable  Last Vitals:  Vitals Value Taken Time  BP    Temp    Pulse    Resp    SpO2      Last Pain:  Vitals:   12/23/17 0910  TempSrc: Tympanic  PainSc: 0-No pain         Complications: No apparent anesthesia complications

## 2017-12-23 NOTE — Anesthesia Post-op Follow-up Note (Signed)
Anesthesia QCDR form completed.        

## 2017-12-23 NOTE — Anesthesia Preprocedure Evaluation (Addendum)
Anesthesia Evaluation  Patient identified by MRN, date of birth, ID band Patient awake    Reviewed: Allergy & Precautions, H&P , NPO status , Patient's Chart, lab work & pertinent test results, reviewed documented beta blocker date and time   History of Anesthesia Complications (+) AWARENESS UNDER ANESTHESIA and history of anesthetic complications  Airway Mallampati: II  TM Distance: >3 FB Neck ROM: full    Dental  (+) Dental Advidsory Given, Upper Dentures, Edentulous Upper, Edentulous Lower   Pulmonary neg shortness of breath, asthma , neg sleep apnea, neg COPD, neg recent URI, Current Smoker,           Cardiovascular Exercise Tolerance: Good negative cardio ROS       Neuro/Psych PSYCHIATRIC DISORDERS Anxiety Depression Bipolar Disorder negative neurological ROS     GI/Hepatic Neg liver ROS, GERD  ,  Endo/Other  diabetes, Oral Hypoglycemic AgentsMorbid obesity  Renal/GU negative Renal ROS  negative genitourinary   Musculoskeletal   Abdominal   Peds  Hematology negative hematology ROS (+)   Anesthesia Other Findings Past Medical History: No date: Anxiety No date: Arthritis No date: Barrett esophagus No date: Barrett's esophagus No date: Bipolar disorder (HCC) 04/02/2011: Breast mass     Comment:  mass removed from left negative No date: Depression No date: Diabetes mellitus without complication (HCC) No date: GERD (gastroesophageal reflux disease) No date: Thyroid disease   Reproductive/Obstetrics negative OB ROS                            Anesthesia Physical Anesthesia Plan  ASA: III  Anesthesia Plan: General   Post-op Pain Management:    Induction: Intravenous  PONV Risk Score and Plan: 2 and Propofol infusion  Airway Management Planned: Nasal Cannula  Additional Equipment:   Intra-op Plan:   Post-operative Plan:   Informed Consent: I have reviewed the patients  History and Physical, chart, labs and discussed the procedure including the risks, benefits and alternatives for the proposed anesthesia with the patient or authorized representative who has indicated his/her understanding and acceptance.   Dental Advisory Given  Plan Discussed with: Anesthesiologist, CRNA and Surgeon  Anesthesia Plan Comments:        Anesthesia Quick Evaluation

## 2017-12-23 NOTE — H&P (Signed)
Outpatient short stay form Pre-procedure 12/23/2017 9:16 AM Christena Deem MD  Primary Physician: Rolm Gala, MD  Reason for visit: EGD and colonoscopy  History of present illness: Patient is a 62 year old female presenting today as above.  She has a family history of colon cancer and a personal history of colon polyps as well as a personal history of Barrett's esophagus without dysplasia.  Reports overall she is doing well with GI.  She has no issues with diarrhea.  She does have a history of likely motility related dysphagia.  Also has a history of anxiety.  He does not regurgitate foods.  Does have reflux symptoms.  Is currently taking Nexium irregularly and occasionally twice a day.  Patient also has a family history of colon cancer and colon polyps in 2 primary relatives.  She has a personal history of adenoma herself.  She tolerated prep well.  Denies use of any aspirin or blood thinning agent.  Occasionally take Tylenol and she will occasionally take an NSAID.   No current facility-administered medications for this encounter.   Medications Prior to Admission  Medication Sig Dispense Refill Last Dose  . acetaminophen (TYLENOL) 500 MG tablet Take 500 mg by mouth every 6 (six) hours as needed.     . carvedilol (COREG) 6.25 MG tablet Take 1 tablet by mouth 2 (two) times daily.   Past Week at Unknown time  . DULoxetine (CYMBALTA) 30 MG capsule Take 30 mg by mouth daily.     Marland Kitchen gabapentin (NEURONTIN) 300 MG capsule Take 300 mg by mouth 3 (three) times daily.     Marland Kitchen loratadine-pseudoephedrine (CLARITIN-D 12-HOUR) 5-120 MG tablet Take 1 tablet by mouth 2 (two) times daily.     Marland Kitchen albuterol (PROVENTIL HFA;VENTOLIN HFA) 108 (90 Base) MCG/ACT inhaler Inhale 2 puffs into the lungs every 6 (six) hours as needed for wheezing or shortness of breath. 1 Inhaler 0   . esomeprazole (NEXIUM) 40 MG capsule Take 1 capsule by mouth daily as needed (GERD or heartburn).    PRN at PRN  . levothyroxine  (SYNTHROID, LEVOTHROID) 175 MCG tablet Take 175 mcg by mouth daily.   12/01/2017 at 0800  . metFORMIN (GLUCOPHAGE-XR) 500 MG 24 hr tablet Take 1,000 mg by mouth every evening.   11/30/2017 at 1800  . pravastatin (PRAVACHOL) 10 MG tablet Take 1 tablet by mouth every evening.   11/30/2017 at Unknown time  . QUEtiapine (SEROQUEL) 25 MG tablet Take 1 tablet by mouth at bedtime as needed (sleep).    PRN at PRN  . sitaGLIPtin (JANUVIA) 100 MG tablet Take 100 mg by mouth daily.   12/01/2017 at 0800     Allergies  Allergen Reactions  . Chloraseptic Max Sore Throat  [Phenol-Glycerin] Anaphylaxis  . Tramadol Other (See Comments)    Other Reaction: FELT DRUNK, NAUSEA & VOMITING  . Haloperidol Decanoate Other (See Comments)    Other Reaction: LOCK JAW  . Lisinopril      Past Medical History:  Diagnosis Date  . Anxiety   . Arthritis   . Barrett esophagus   . Barrett's esophagus   . Bipolar disorder (HCC)   . Breast mass 04/02/2011   mass removed from left negative  . Depression   . Diabetes mellitus without complication (HCC)   . GERD (gastroesophageal reflux disease)   . Thyroid disease     Review of systems:      Physical Exam    Heart and lungs: Regular rate and rhythm without rub  or gallop, lungs are bilaterally clear.    HEENT: Normocephalic atraumatic eyes are anicteric    Other:    Pertinant exam for procedure: Soft protuberant mild tenderness to palpation in the right upper quadrant and epigastric region.  There are no masses or rebound.  Abdomen is protuberant/obese.  All sounds are positive and normoactive    Planned proceedures: EGD, colonoscopy and indicated procedures. I have discussed the risks benefits and complications of procedures to include not limited to bleeding, infection, perforation and the risk of sedation and the patient wishes to proceed.    Christena Deem, MD Gastroenterology 12/23/2017  9:16 AM

## 2017-12-23 NOTE — Anesthesia Procedure Notes (Signed)
Performed by: Cook-Martin, Krizia Flight Pre-anesthesia Checklist: Patient identified, Emergency Drugs available, Suction available, Timeout performed and Patient being monitored Patient Re-evaluated:Patient Re-evaluated prior to induction Oxygen Delivery Method: Nasal cannula Preoxygenation: Pre-oxygenation with 100% oxygen Induction Type: IV induction Airway Equipment and Method: Bite block Placement Confirmation: CO2 detector and positive ETCO2       

## 2017-12-23 NOTE — Op Note (Signed)
[facility] Gastroenterology Patient Name: [name] Procedure Date: 12/23/2017 9:29 AM MRN: [id number] Account #: [id number] Date of Birth: July 17, 1956 Admit Type: Outpatient Age: 62 Room: [facility] ENDO ROOM 3 Gender: Female Note Status: Finalized Procedure:            Colonoscopy Indications:          Family history of colon cancer in a first-degree                        relative, Family history of colonic polyps in a                        first-degree relative, Personal history of colonic                        polyps Providers:            Christena Deem, MD Referring MD:         Letitia Caul MD, MD (Referring MD) Medicines:            Monitored Anesthesia Care Complications:        No immediate complications. Procedure:            Pre-Anesthesia Assessment:                       - ASA Grade Assessment: III - A patient with severe                        systemic disease.                       After obtaining informed consent, the colonoscope was                        passed under direct vision. Throughout the procedure,                        the patient's blood pressure, pulse, and oxygen                        saturations were monitored continuously. The                        Colonoscope was introduced through the anus and                        advanced to the the cecum, identified by appendiceal                        orifice and ileocecal valve. The colonoscopy was                        unusually difficult due to a redundant colon and the                        patient's body habitus. Successful completion of the                        procedure was aided by changing the patient to a prone  position. The patient tolerated the procedure well. The                        quality of the bowel preparation was good. Findings:      A few small-mouthed diverticula were found in the sigmoid colon and       descending colon.   A 3 mm polyp was found in the proximal transverse colon. The polyp was       sessile. The polyp was removed with a cold biopsy forceps. Resection and       retrieval were complete.      A 2 mm polyp was found in the descending colon. The polyp was sessile.       The polyp was removed with a cold biopsy forceps. Resection and       retrieval were complete.      A 3 mm polyp was found in the distal sigmoid colon. The polyp was       sessile. The polyp was removed with a cold biopsy forceps. Resection and       retrieval were complete.      A 2 mm polyp was found in the rectum. The polyp was sessile. The polyp       was removed with a cold biopsy forceps. Resection and retrieval were       complete.      Hemorrhoids were found on perianal exam.      Non-bleeding external hemorrhoids were found during perianal exam. The       hemorrhoids were small.      The terminal ileum appeared normal. Impression:           - Diverticulosis in the sigmoid colon and in the                        descending colon.                       - One 3 mm polyp in the proximal transverse colon,                        removed with a cold biopsy forceps. Resected and                        retrieved.                       - One 2 mm polyp in the descending colon, removed with                        a cold biopsy forceps. Resected and retrieved.                       - One 3 mm polyp in the distal sigmoid colon, removed                        with a cold biopsy forceps. Resected and retrieved.                       - One 2 mm polyp in the rectum, removed with a cold  biopsy forceps. Resected and retrieved.                       - Hemorrhoids found on perianal exam.                       - Non-bleeding external hemorrhoids. Recommendation:       - Await pathology results.                       - Telephone GI clinic for pathology results in 1 week. Procedure Code(s):    --- Professional ---                        [phone], Colonoscopy, flexible; with biopsy, single or                        multiple Diagnosis Code(s):    --- Professional ---                       K64.4, Residual hemorrhoidal skin tags                       D12.3, Benign neoplasm of transverse colon (hepatic                        flexure or splenic flexure)                       D12.4, Benign neoplasm of descending colon                       D12.5, Benign neoplasm of sigmoid colon                       K62.1, Rectal polyp                       Z80.0, Family history of malignant neoplasm of                        digestive organs                       Z83.71, Family history of colonic polyps                       Z86.010, Personal history of colonic polyps                       K57.30, Diverticulosis of large intestine without                        perforation or abscess without bleeding CPT copyright 2017 American Medical Association. All rights reserved. The codes documented in this report are preliminary and upon coder review may  be revised to meet current compliance requirements. Christena Deem, MD 12/23/2017 10:46:51 AM This report has been signed electronically. Number of Addenda: 0 Note Initiated On: 12/23/2017 9:29 AM Scope Withdrawal Time: 0 hours 10 minutes 40 seconds  Total Procedure Duration: 0 hours 25 minutes 47 seconds       [facility]

## 2017-12-24 ENCOUNTER — Encounter: Payer: Self-pay | Admitting: Gastroenterology

## 2017-12-24 LAB — SURGICAL PATHOLOGY

## 2018-02-15 ENCOUNTER — Other Ambulatory Visit: Payer: Self-pay

## 2018-02-15 ENCOUNTER — Ambulatory Visit
Admission: EM | Admit: 2018-02-15 | Discharge: 2018-02-15 | Disposition: A | Discharge status: Home / Self Care | Payer: Medicare Other | Attending: Family Medicine | Admitting: Family Medicine

## 2018-02-15 DIAGNOSIS — L290 Pruritus ani: Secondary | ICD-10-CM | POA: Diagnosis not present

## 2018-02-15 DIAGNOSIS — K644 Residual hemorrhoidal skin tags: Secondary | ICD-10-CM

## 2018-02-15 MED ORDER — HYDROCORTISONE 1 % RE CREA: 1.0000 "application " | TOPICAL_CREAM | Freq: Two times a day (BID) | RECTAL | 0 refills | Status: DC
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
## 2018-02-15 NOTE — Discharge Instructions (Signed)
Sitz baths Tucks pads

## 2018-02-15 NOTE — ED Triage Notes (Signed)
Patient complains of rectal itching x 2 days. Patient states that she saw something on facebook about cherries may be having worms and she had recently ate some cherries. Patient states that itching is worse with having a bowel movement or when wiping. Patient states that she was told when she had her colonoscopy that she had a hemorrhoid so she is unsure if this could cause it.

## 2018-03-14 ENCOUNTER — Other Ambulatory Visit: Payer: Self-pay | Admitting: Family Medicine

## 2018-04-08 ENCOUNTER — Other Ambulatory Visit: Payer: Self-pay | Admitting: Gastroenterology

## 2018-04-08 DIAGNOSIS — R140 Abdominal distension (gaseous): Secondary | ICD-10-CM

## 2018-04-08 DIAGNOSIS — K219 Gastro-esophageal reflux disease without esophagitis: Secondary | ICD-10-CM

## 2018-04-08 DIAGNOSIS — R1011 Right upper quadrant pain: Secondary | ICD-10-CM

## 2018-04-29 ENCOUNTER — Encounter
Admission: RE | Admit: 2018-04-29 | Discharge: 2018-04-29 | Disposition: A | Discharge status: Home / Self Care | Payer: Medicare Other | Source: Ambulatory Visit | Attending: Gastroenterology | Admitting: Gastroenterology

## 2018-04-29 ENCOUNTER — Ambulatory Visit
Admission: RE | Admit: 2018-04-29 | Discharge: 2018-04-29 | Disposition: A | Discharge status: Home / Self Care | Payer: Medicare Other | Source: Ambulatory Visit | Attending: Gastroenterology | Admitting: Gastroenterology

## 2018-04-29 DIAGNOSIS — R140 Abdominal distension (gaseous): Secondary | ICD-10-CM | POA: Diagnosis present

## 2018-04-29 DIAGNOSIS — K219 Gastro-esophageal reflux disease without esophagitis: Secondary | ICD-10-CM | POA: Diagnosis present

## 2018-04-29 DIAGNOSIS — R1011 Right upper quadrant pain: Secondary | ICD-10-CM | POA: Diagnosis not present

## 2018-04-29 MED ORDER — TECHNETIUM TC 99M MEBROFENIN IV KIT
5.4900 | PACK | Freq: Once | INTRAVENOUS | Status: AC | PRN
  Administered 2018-04-29: 5.49 via INTRAVENOUS

## 2018-06-01 ENCOUNTER — Other Ambulatory Visit: Payer: Self-pay | Admitting: General Surgery

## 2018-06-01 DIAGNOSIS — K469 Unspecified abdominal hernia without obstruction or gangrene: Secondary | ICD-10-CM

## 2018-06-05 ENCOUNTER — Other Ambulatory Visit: Payer: Self-pay | Admitting: General Surgery

## 2018-06-05 ENCOUNTER — Other Ambulatory Visit
Admission: RE | Admit: 2018-06-05 | Discharge: 2018-06-05 | Disposition: A | Discharge status: Home / Self Care | Payer: Medicare Other | Source: Ambulatory Visit | Attending: General Surgery | Admitting: General Surgery

## 2018-06-05 ENCOUNTER — Ambulatory Visit
Admission: RE | Admit: 2018-06-05 | Discharge: 2018-06-05 | Disposition: A | Discharge status: Home / Self Care | Payer: Medicare Other | Source: Ambulatory Visit | Attending: General Surgery | Admitting: General Surgery

## 2018-06-05 DIAGNOSIS — N200 Calculus of kidney: Secondary | ICD-10-CM | POA: Insufficient documentation

## 2018-06-05 DIAGNOSIS — K439 Ventral hernia without obstruction or gangrene: Secondary | ICD-10-CM | POA: Insufficient documentation

## 2018-06-05 DIAGNOSIS — K469 Unspecified abdominal hernia without obstruction or gangrene: Secondary | ICD-10-CM | POA: Diagnosis present

## 2018-06-05 DIAGNOSIS — K760 Fatty (change of) liver, not elsewhere classified: Secondary | ICD-10-CM | POA: Insufficient documentation

## 2018-06-05 HISTORY — DX: Essential (primary) hypertension: I10

## 2018-06-05 LAB — CREATININE, SERUM
Creatinine, Ser: 0.64 mg/dL (ref 0.44–1.00)
GFR calc non Af Amer: >60 mL/min (ref >60)

## 2018-06-05 MED ORDER — IOHEXOL 300 MG/ML  SOLN
100.0000 mL | Freq: Once | INTRAMUSCULAR | Status: AC | PRN
  Administered 2018-06-05: 100 mL via INTRAVENOUS

## 2018-09-01 ENCOUNTER — Other Ambulatory Visit: Payer: Self-pay | Admitting: Family Medicine

## 2018-09-01 DIAGNOSIS — Z1231 Encounter for screening mammogram for malignant neoplasm of breast: Secondary | ICD-10-CM

## 2018-10-12 ENCOUNTER — Ambulatory Visit
Admission: RE | Admit: 2018-10-12 | Discharge: 2018-10-12 | Disposition: A | Discharge status: Home / Self Care | Payer: Medicare Other | Source: Ambulatory Visit | Attending: Family Medicine | Admitting: Family Medicine

## 2018-10-12 ENCOUNTER — Other Ambulatory Visit: Payer: Self-pay

## 2018-10-12 ENCOUNTER — Encounter (INDEPENDENT_AMBULATORY_CARE_PROVIDER_SITE_OTHER): Payer: Self-pay

## 2018-10-12 DIAGNOSIS — Z1231 Encounter for screening mammogram for malignant neoplasm of breast: Secondary | ICD-10-CM | POA: Diagnosis not present

## 2018-11-30 IMAGING — CT CT ABDOMEN W/ CM
1 of 3 series · 13 of 32 positions shown, 18 images · IV contrast (APPLIED)
Comparison: 10/19/2016. Right upper quadrant ultrasound 04/29/2018.

CLINICAL DATA: Subxiphoid protrusion.  Umbilical hernia.

EXAM:
CT ABDOMEN WITH CONTRAST
TECHNIQUE: Multidetector CT imaging of the abdomen was performed using the
standard protocol following bolus administration of intravenous
contrast.
CONTRAST:  100mL OMNIPAQUE IOHEXOL 300 MG/ML  SOLN

[Series 10: axial st · axial · 0.80mm/px · z∈[-318,-62]mm · 13 of 59 slices shown, 18 images]
[im 4/59  soft-tissue]
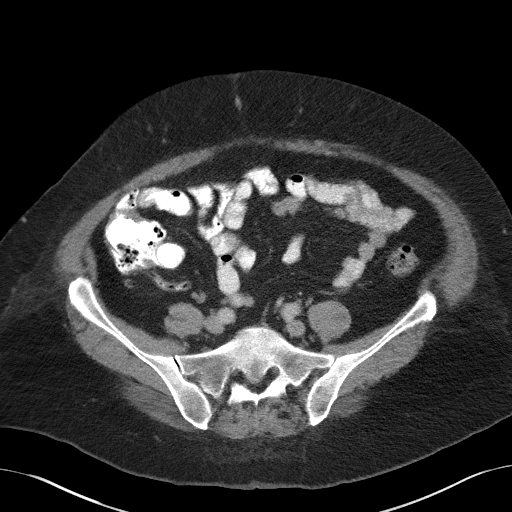
[im 4/59  bone]
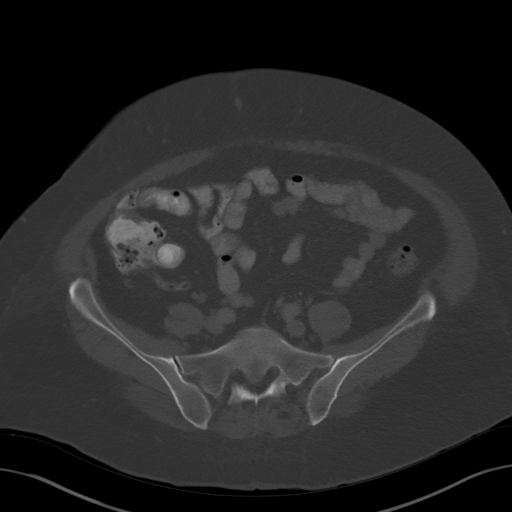
[im 8/59  soft-tissue]
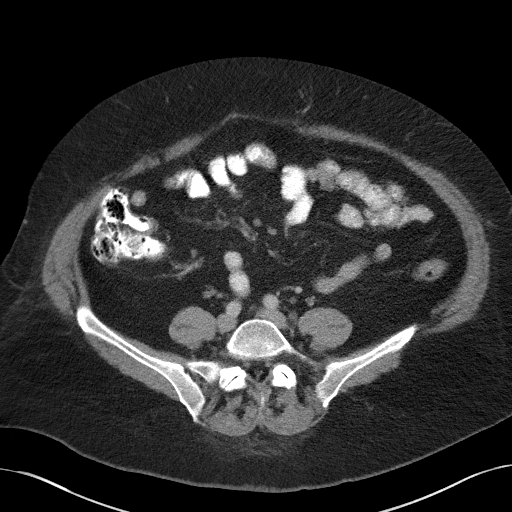
[im 15/59  soft-tissue]
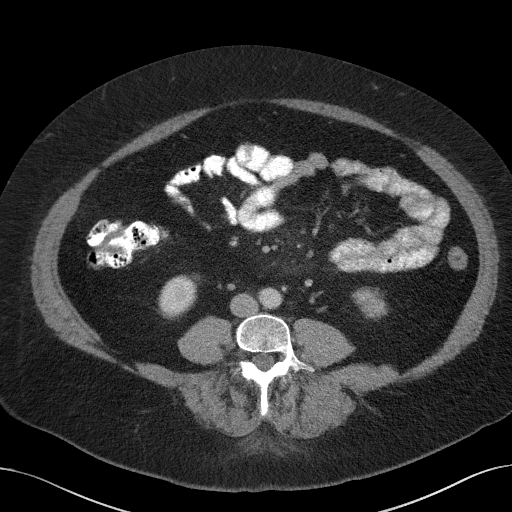
[im 19/59  soft-tissue]
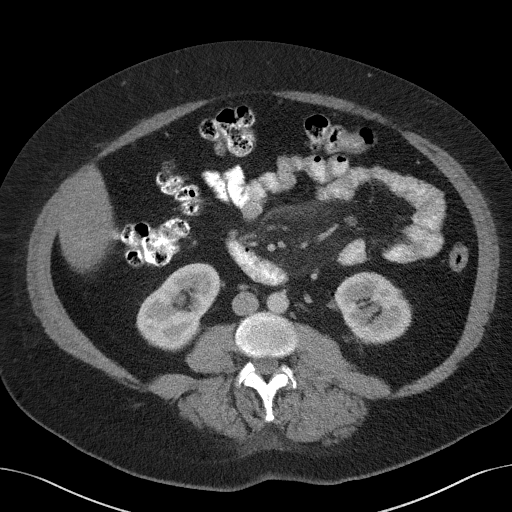
[im 22/59  soft-tissue]
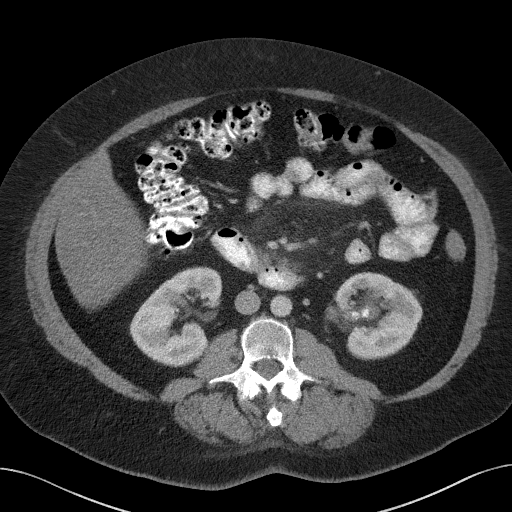
[im 26/59  soft-tissue]
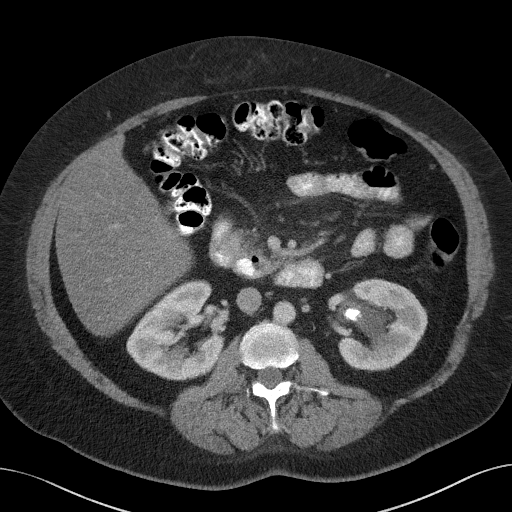
[im 33/59  soft-tissue]
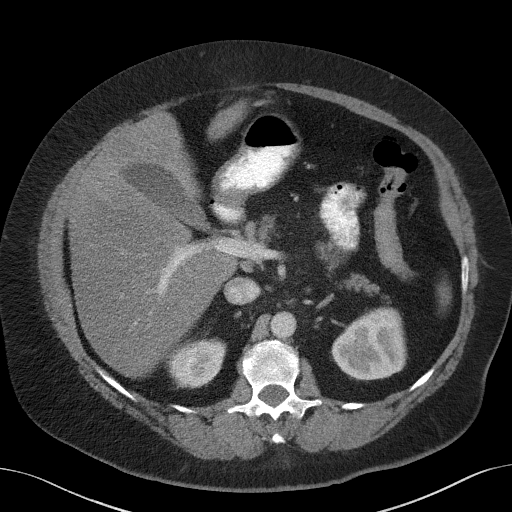
[im 37/59  soft-tissue]
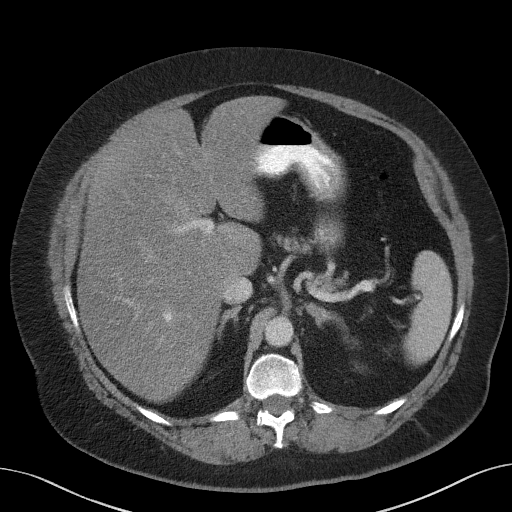
[im 40/59  soft-tissue]
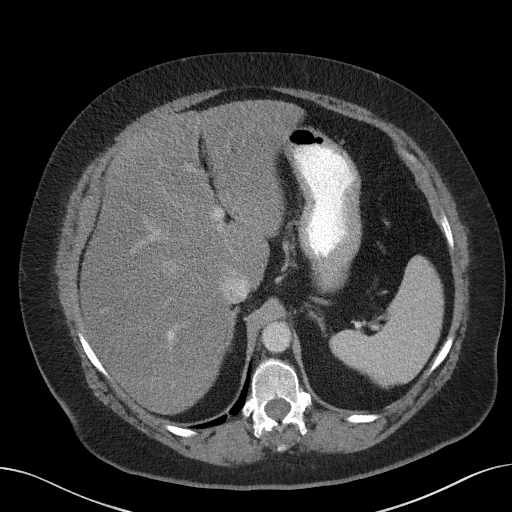
[im 40/59  bone]
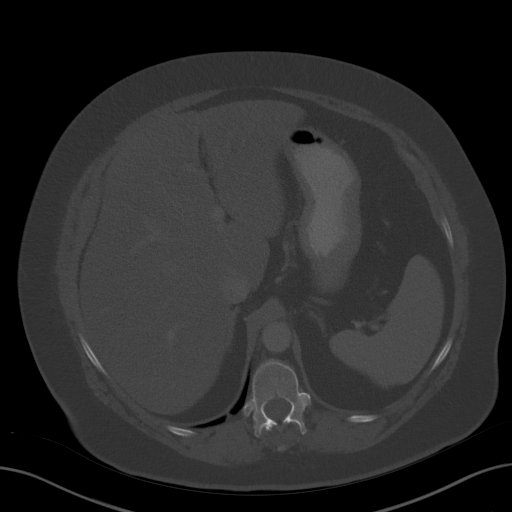
[im 44/59  soft-tissue]
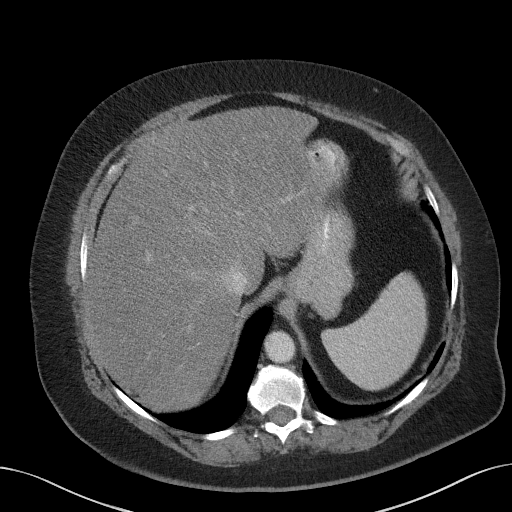
[im 44/59  lung]
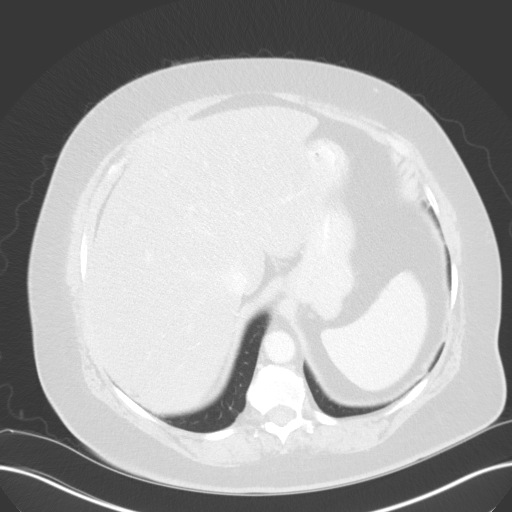
[im 48/59  lung]
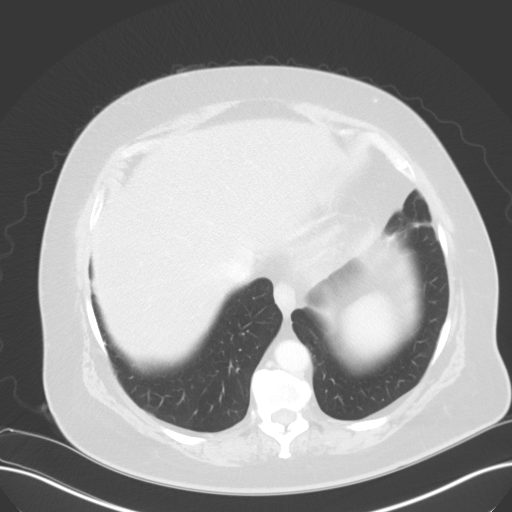
[im 51/59  soft-tissue]
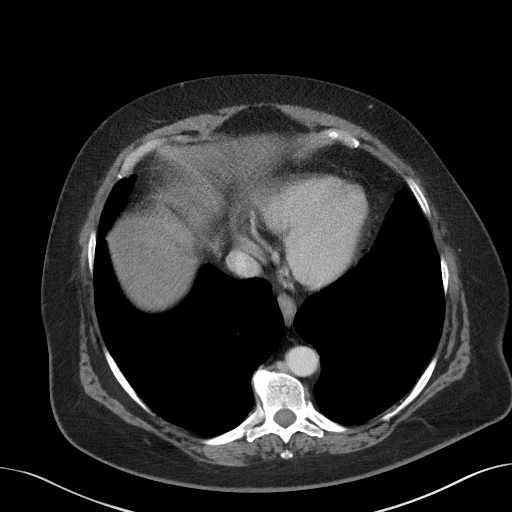
[im 51/59  lung]
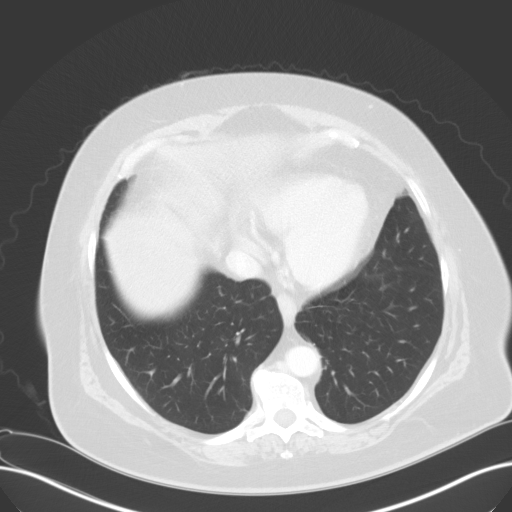
[im 55/59  soft-tissue]
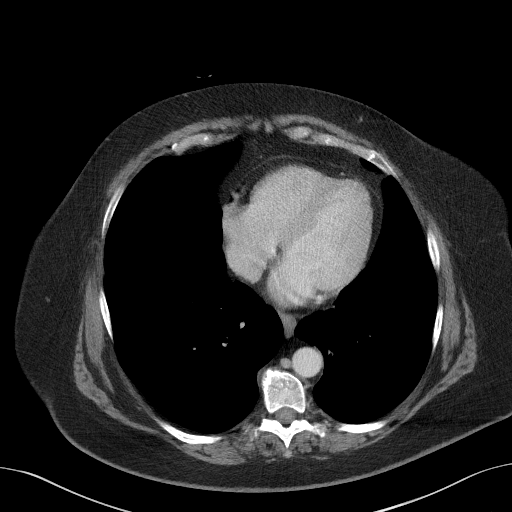
[im 55/59  lung]
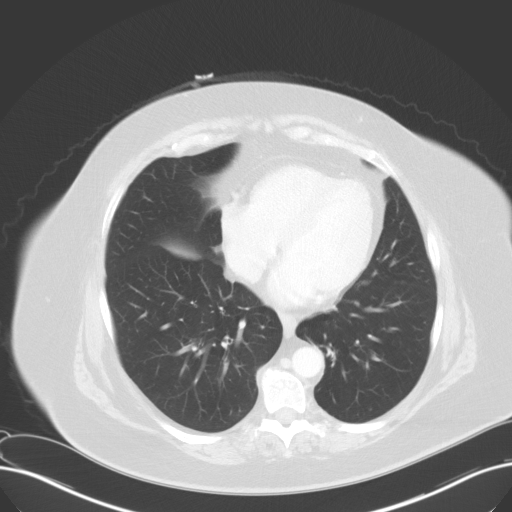

[13 of 32 positions shown; findings below may reference images not displayed]

FINDINGS: Lower chest: Bibasilar subsegmental atelectasis. Normal heart size
without pericardial or pleural effusion.

Hepatobiliary: Moderate hepatic steatosis with borderline
hepatomegaly. 17.7 cm craniocaudal. No focal liver lesion. Normal
gallbladder, without biliary ductal dilatation.

Pancreas: Normal, without mass or ductal dilatation.

Spleen: Normal in size, without focal abnormality.

Adrenals/Urinary Tract: Normal adrenal glands. Left renal pelvic
dominant stone measures 14 mm today versus 13 mm on the prior. There
smaller stones within the more peripheral and inferior left renal
pelvis. These measure maximally 6 mm today versus 5 mm on
10/19/2016. Mild perinephric interstitial thickening, with slight
increase in mild primarily upper pole caliectasis.

Stomach/Bowel: Normal stomach, without wall thickening. Normal
abdominal portions of the colon, terminal ileum, appendix.

A small periampullary duodenal diverticulum. Otherwise normal
abdominal small bowel loops.

Vascular/Lymphatic: Accessory upper pole left renal artery. Normal
caliber of the aorta and branch vessels. No abdominal adenopathy.
There are small nodes within the jejunal mesentery with increased
density in the mesenteric fat, chronic.

Other: No ascites. A right paramidline fat containing ventral
abdominal wall hernia is again identified. The neck is enlarged ,
1.9 cm today versus 1.4 cm on the prior. No evidence of
strangulation or bowel involvement.

Musculoskeletal: No acute osseous abnormality.
IMPRESSION: 1. Mild enlargement of a fat containing right paramidline ventral
abdominal wall hernia compared to 10/19/2016. No evidence of
strangulation or bowel involvement.
2. Slight enlargement of left renal stones, including a dominant
renal pelvic stone (developing staghorn type). Increase in mild
primarily upper pole caliectasis.
3. Hepatic steatosis and borderline hepatomegaly.
4. Similar small bowel mesenteric findings which could represent
mesenteric adenitis/panniculitis.

## 2019-06-24 ENCOUNTER — Ambulatory Visit: Payer: Medicare Other | Admitting: Skilled Nursing Facility1

## 2019-08-24 ENCOUNTER — Other Ambulatory Visit: Payer: Self-pay | Admitting: Family Medicine

## 2019-08-24 DIAGNOSIS — E039 Hypothyroidism, unspecified: Secondary | ICD-10-CM

## 2019-08-24 DIAGNOSIS — R1011 Right upper quadrant pain: Secondary | ICD-10-CM

## 2019-08-27 ENCOUNTER — Other Ambulatory Visit: Payer: Self-pay

## 2019-08-27 ENCOUNTER — Other Ambulatory Visit: Admission: RE | Admit: 2019-08-27 | Payer: Medicare Other | Source: Home / Self Care

## 2019-08-30 ENCOUNTER — Other Ambulatory Visit: Payer: Self-pay

## 2019-08-30 ENCOUNTER — Ambulatory Visit
Admission: RE | Admit: 2019-08-30 | Discharge: 2019-08-30 | Disposition: A | Discharge status: Home / Self Care | Payer: Medicare Other | Source: Ambulatory Visit | Attending: Family Medicine | Admitting: Family Medicine

## 2019-08-30 ENCOUNTER — Other Ambulatory Visit
Admission: RE | Admit: 2019-08-30 | Discharge: 2019-08-30 | Disposition: A | Discharge status: Home / Self Care | Payer: Medicare Other | Source: Home / Self Care | Attending: Family Medicine | Admitting: Family Medicine

## 2019-08-30 DIAGNOSIS — E039 Hypothyroidism, unspecified: Secondary | ICD-10-CM | POA: Insufficient documentation

## 2019-08-30 DIAGNOSIS — R1012 Left upper quadrant pain: Secondary | ICD-10-CM | POA: Diagnosis present

## 2019-08-30 DIAGNOSIS — R1011 Right upper quadrant pain: Secondary | ICD-10-CM | POA: Diagnosis not present

## 2019-08-30 LAB — CREATININE, SERUM
Creatinine, Ser: 0.85 mg/dL (ref 0.44–1.00)
GFR calc Af Amer: >60 mL/min (ref >60)
GFR calc non Af Amer: >60 mL/min (ref >60)

## 2019-08-30 MED ORDER — IOHEXOL 300 MG/ML  SOLN
100.0000 mL | Freq: Once | INTRAMUSCULAR | Status: AC | PRN
  Administered 2019-08-30: 100 mL via INTRAVENOUS

## 2020-01-27 ENCOUNTER — Ambulatory Visit
Admission: RE | Admit: 2020-01-27 | Discharge: 2020-01-27 | Disposition: A | Discharge status: Home / Self Care | Payer: Medicare Other | Attending: Family Medicine | Admitting: Family Medicine

## 2020-01-27 ENCOUNTER — Other Ambulatory Visit: Payer: Self-pay

## 2020-01-27 ENCOUNTER — Other Ambulatory Visit: Payer: Self-pay | Admitting: Family Medicine

## 2020-01-27 ENCOUNTER — Ambulatory Visit
Admission: RE | Admit: 2020-01-27 | Discharge: 2020-01-27 | Disposition: A | Discharge status: Home / Self Care | Payer: Medicare Other | Source: Ambulatory Visit | Attending: Family Medicine | Admitting: Family Medicine

## 2020-01-27 DIAGNOSIS — M5416 Radiculopathy, lumbar region: Secondary | ICD-10-CM | POA: Insufficient documentation

## 2020-01-27 DIAGNOSIS — Z1231 Encounter for screening mammogram for malignant neoplasm of breast: Secondary | ICD-10-CM

## 2020-02-03 ENCOUNTER — Ambulatory Visit: Payer: Medicare Other

## 2020-05-18 ENCOUNTER — Ambulatory Visit
Admission: RE | Admit: 2020-05-18 | Discharge: 2020-05-18 | Disposition: A | Discharge status: Home / Self Care | Payer: Medicare Other | Source: Ambulatory Visit | Attending: Family Medicine | Admitting: Family Medicine

## 2020-05-18 ENCOUNTER — Other Ambulatory Visit: Payer: Self-pay

## 2020-05-18 DIAGNOSIS — Z1231 Encounter for screening mammogram for malignant neoplasm of breast: Secondary | ICD-10-CM | POA: Diagnosis not present

## 2020-06-09 ENCOUNTER — Encounter: Payer: Self-pay | Admitting: Emergency Medicine

## 2020-06-09 ENCOUNTER — Ambulatory Visit
Admission: EM | Admit: 2020-06-09 | Discharge: 2020-06-09 | Disposition: A | Discharge status: Home / Self Care | Payer: Medicare Other

## 2020-06-09 ENCOUNTER — Other Ambulatory Visit: Payer: Self-pay

## 2020-06-09 ENCOUNTER — Ambulatory Visit (INDEPENDENT_AMBULATORY_CARE_PROVIDER_SITE_OTHER): Payer: Medicare Other

## 2020-06-09 DIAGNOSIS — W19XXXA Unspecified fall, initial encounter: Secondary | ICD-10-CM | POA: Diagnosis not present

## 2020-06-09 DIAGNOSIS — M25512 Pain in left shoulder: Secondary | ICD-10-CM | POA: Diagnosis not present

## 2020-06-09 DIAGNOSIS — S4992XA Unspecified injury of left shoulder and upper arm, initial encounter: Secondary | ICD-10-CM

## 2020-06-09 DIAGNOSIS — M25561 Pain in right knee: Secondary | ICD-10-CM

## 2020-06-09 DIAGNOSIS — S8991XA Unspecified injury of right lower leg, initial encounter: Secondary | ICD-10-CM

## 2020-06-09 DIAGNOSIS — S43402A Unspecified sprain of left shoulder joint, initial encounter: Secondary | ICD-10-CM

## 2020-06-09 DIAGNOSIS — S39012A Strain of muscle, fascia and tendon of lower back, initial encounter: Secondary | ICD-10-CM | POA: Diagnosis not present

## 2020-06-09 DIAGNOSIS — M25562 Pain in left knee: Secondary | ICD-10-CM | POA: Diagnosis not present

## 2020-06-09 MED ORDER — MELOXICAM 15 MG PO TABS: 15.0000 mg | ORAL_TABLET | Freq: Every day | ORAL | 0 refills | Status: AC

## 2020-06-09 MED ORDER — CYCLOBENZAPRINE HCL 10 MG PO TABS: 10.0000 mg | ORAL_TABLET | Freq: Three times a day (TID) | ORAL | 0 refills | Status: AC | PRN

## 2020-06-09 NOTE — Discharge Instructions (Addendum)
Your knee x-ray was normal.  Your shoulder x-ray shows some widening of the joint so you may have strained or sprained a ligament.  Use sling if needed for a couple of days.  Then, you should try to move the arm as tolerated.  You can ice the shoulder.  Try the meloxicam and Tylenol for pain relief.  Follow-up with orthopedics if not getting better over the next week or 2 as you may need further imaging of the shoulder since you already had chronic changes in the shoulder.  BACK PAIN: Stressed avoiding painful activities . RICE (REST, ICE, COMPRESSION, ELEVATION) guidelines reviewed. May alternate ice and heat. Consider use of muscle rubs, Salonpas patches, etc. Use medications as directed including muscle relaxers if prescribed. Take anti-inflammatory medications as prescribed or OTC NSAIDs/Tylenol.  F/u with PCP in 7-10 days for reexamination, and please feel free to call or return to the urgent care at any time for any questions or concerns you may have and we will be happy to help you!   BACK PAIN RED FLAGS: If the back pain acutely worsens or there are any red flag symptoms such as numbness/tingling, leg weakness, saddle anesthesia, or loss of bowel/bladder control, go immediately to the ER. Follow up with [place] as scheduled or sooner if the pain does not begin to resolve or if it worsens before the follow up
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
## 2021-03-16 ENCOUNTER — Other Ambulatory Visit: Payer: Self-pay

## 2021-03-16 DIAGNOSIS — Z1231 Encounter for screening mammogram for malignant neoplasm of breast: Secondary | ICD-10-CM

## 2021-05-22 ENCOUNTER — Ambulatory Visit
Admission: RE | Admit: 2021-05-22 | Discharge: 2021-05-22 | Disposition: A | Discharge status: Home / Self Care | Payer: Medicare Other | Source: Ambulatory Visit

## 2021-05-22 ENCOUNTER — Other Ambulatory Visit: Payer: Self-pay

## 2021-05-22 DIAGNOSIS — Z1231 Encounter for screening mammogram for malignant neoplasm of breast: Secondary | ICD-10-CM

## 2023-01-12 ENCOUNTER — Ambulatory Visit
Admission: EM | Admit: 2023-01-12 | Discharge: 2023-01-12 | Disposition: A | Discharge status: Home / Self Care | Payer: 59 | Attending: Emergency Medicine | Admitting: Emergency Medicine

## 2023-01-12 DIAGNOSIS — L22 Diaper dermatitis: Secondary | ICD-10-CM | POA: Diagnosis present

## 2023-01-12 DIAGNOSIS — N3001 Acute cystitis with hematuria: Secondary | ICD-10-CM | POA: Diagnosis present

## 2023-01-12 DIAGNOSIS — H6123 Impacted cerumen, bilateral: Secondary | ICD-10-CM | POA: Diagnosis present

## 2023-01-12 LAB — URINALYSIS, W/ REFLEX TO CULTURE (INFECTION SUSPECTED)
Bilirubin Urine: NEGATIVE
Glucose, UA: NEGATIVE mg/dL
Ketones, ur: NEGATIVE mg/dL
Nitrite: POSITIVE — AB
Protein, ur: 30 mg/dL — AB
Specific Gravity, Urine: 1.025 (ref 1.005–1.030)
WBC, UA: >50 WBC/hpf (ref 0–5)
pH: 5.5 (ref 5.0–8.0)

## 2023-01-12 LAB — URINE CULTURE

## 2023-01-12 MED ORDER — CEPHALEXIN 500 MG PO CAPS: 500.0000 mg | ORAL_CAPSULE | Freq: Two times a day (BID) | ORAL | 0 refills | Status: AC

## 2023-01-12 MED ORDER — CLOTRIMAZOLE-BETAMETHASONE 1-0.05 % EX CREA: TOPICAL_CREAM | CUTANEOUS | 0 refills | Status: DC

## 2023-01-12 MED ORDER — PHENAZOPYRIDINE HCL 200 MG PO TABS: 200.0000 mg | ORAL_TABLET | Freq: Three times a day (TID) | ORAL | 0 refills | Status: DC
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
## 2023-01-12 NOTE — ED Triage Notes (Addendum)
Patient presents with an ear infection and a UTI. Patient states it burns when she urinate and low back pain. Pt states UTI sx started 7 days ago and ear drainage 5 days ago.  Patient states she have a diaper rash.

## 2023-01-12 NOTE — Discharge Instructions (Addendum)
Take the keflex twice daily for 7 days with food for treatment of urinary tract infection.  Use the Pyridium every 8 hours as needed for urinary discomfort.  This will turn your urine a bright red-orange.  Increase your oral fluid intake so that you increase your urine production and or flushing your urinary system.  Take an over-the-counter probiotic, such as Culturelle-Align-Activia, 1 hour after each dose of antibiotic to prevent diarrhea or yeast infections from forming.  We will culture urine and change the antibiotics if necessary.  Apply the Lotrisone cream to the rash in your perineal region twice daily.  Keep the area clean and dry as best as possible.  You may also continue to use barrier cream such as monkey Butt paste, Desitin, or bag balm.  Keep your appointment for follow-up with your PCP on Tuesday as scheduled for reevaluation of your perineal rash as well as your urinary symptoms.  Return for reevaluation, or see your primary care provider, for any new or worsening symptoms.

## 2023-01-15 LAB — URINE CULTURE

## 2023-01-29 ENCOUNTER — Ambulatory Visit
Admission: EM | Admit: 2023-01-29 | Discharge: 2023-01-29 | Disposition: A | Discharge status: Home / Self Care | Payer: 59 | Attending: Emergency Medicine | Admitting: Emergency Medicine

## 2023-01-29 DIAGNOSIS — H6061 Unspecified chronic otitis externa, right ear: Secondary | ICD-10-CM | POA: Diagnosis not present

## 2023-01-29 DIAGNOSIS — H6121 Impacted cerumen, right ear: Secondary | ICD-10-CM

## 2023-01-29 MED ORDER — NEOMYCIN-POLYMYXIN-HC 3.5-10000-1 OT SUSP: 4.0000 [drp] | Freq: Three times a day (TID) | OTIC | 0 refills | Status: DC
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
## 2023-01-29 NOTE — ED Triage Notes (Signed)
Pt presents c/o RT ear ache, pt states she was seen on 01/12/23 but ear not better. Pt states ear "feels spongy" very painful.

## 2023-01-29 NOTE — Discharge Instructions (Signed)
Instill 4 drops of Cortisporin otic in your right ear 3x a day, for treatment of your ear infection for 5 days.  Take an over-the-counter probiotic 1 hour after each dose of antibiotic to prevent diarrhea.  Use over-the-counter Tylenol and ibuprofen as needed for pain or fever.  Place a hot water bottle, or heating pad, underneath your pillowcase at night to help dilate up your ear and aid in pain relief as well as resolution of the infection.  Return for reevaluation for any new or worsening symptoms.

## 2023-02-06 ENCOUNTER — Other Ambulatory Visit: Payer: Self-pay | Admitting: Gastroenterology

## 2023-02-06 DIAGNOSIS — R1032 Left lower quadrant pain: Secondary | ICD-10-CM

## 2023-02-06 DIAGNOSIS — R1031 Right lower quadrant pain: Secondary | ICD-10-CM

## 2023-02-07 ENCOUNTER — Ambulatory Visit
Admission: RE | Admit: 2023-02-07 | Discharge: 2023-02-07 | Disposition: A | Discharge status: Home / Self Care | Payer: 59 | Source: Ambulatory Visit | Attending: Gastroenterology | Admitting: Gastroenterology

## 2023-02-07 DIAGNOSIS — R1032 Left lower quadrant pain: Secondary | ICD-10-CM

## 2023-02-07 DIAGNOSIS — R1031 Right lower quadrant pain: Secondary | ICD-10-CM

## 2023-02-07 MED ORDER — IOPAMIDOL (ISOVUE-300) INJECTION 61%
100.0000 mL | Freq: Once | INTRAVENOUS | Status: AC | PRN
  Administered 2023-02-07: 100 mL via INTRAVENOUS

## 2023-04-17 ENCOUNTER — Ambulatory Visit: Admission: RE | Admit: 2023-04-17 | Payer: 59 | Source: Home / Self Care | Admitting: Gastroenterology

## 2023-04-17 ENCOUNTER — Encounter: Admission: RE | Payer: Self-pay | Source: Home / Self Care

## 2023-04-17 SURGERY — COLONOSCOPY WITH PROPOFOL
Anesthesia: General

## 2023-05-15 ENCOUNTER — Other Ambulatory Visit: Payer: Self-pay | Admitting: Physician Assistant

## 2023-05-15 DIAGNOSIS — Z1231 Encounter for screening mammogram for malignant neoplasm of breast: Secondary | ICD-10-CM

## 2023-06-27 ENCOUNTER — Emergency Department: Payer: 59

## 2023-06-27 ENCOUNTER — Other Ambulatory Visit: Payer: Self-pay

## 2023-06-27 ENCOUNTER — Emergency Department
Admission: EM | Admit: 2023-06-27 | Discharge: 2023-06-28 | Disposition: A | Discharge status: Home / Self Care | Payer: 59 | Attending: Emergency Medicine | Admitting: Emergency Medicine

## 2023-06-27 DIAGNOSIS — R2243 Localized swelling, mass and lump, lower limb, bilateral: Secondary | ICD-10-CM | POA: Insufficient documentation

## 2023-06-27 DIAGNOSIS — I10 Essential (primary) hypertension: Secondary | ICD-10-CM | POA: Diagnosis not present

## 2023-06-27 DIAGNOSIS — E119 Type 2 diabetes mellitus without complications: Secondary | ICD-10-CM | POA: Insufficient documentation

## 2023-06-27 DIAGNOSIS — M7989 Other specified soft tissue disorders: Secondary | ICD-10-CM

## 2023-06-27 DIAGNOSIS — R197 Diarrhea, unspecified: Secondary | ICD-10-CM | POA: Diagnosis not present

## 2023-06-27 DIAGNOSIS — Z1152 Encounter for screening for COVID-19: Secondary | ICD-10-CM | POA: Insufficient documentation

## 2023-06-27 DIAGNOSIS — E039 Hypothyroidism, unspecified: Secondary | ICD-10-CM | POA: Diagnosis not present

## 2023-06-27 DIAGNOSIS — R601 Generalized edema: Secondary | ICD-10-CM | POA: Diagnosis not present

## 2023-06-27 DIAGNOSIS — Z79890 Hormone replacement therapy (postmenopausal): Secondary | ICD-10-CM | POA: Diagnosis not present

## 2023-06-27 DIAGNOSIS — Z7984 Long term (current) use of oral hypoglycemic drugs: Secondary | ICD-10-CM | POA: Insufficient documentation

## 2023-06-27 DIAGNOSIS — Z79899 Other long term (current) drug therapy: Secondary | ICD-10-CM | POA: Insufficient documentation

## 2023-06-27 DIAGNOSIS — R600 Localized edema: Secondary | ICD-10-CM

## 2023-06-27 LAB — CBC
HCT: 35.4 % — ABNORMAL LOW (ref 36.0–46.0)
Hemoglobin: 11.3 g/dL — ABNORMAL LOW (ref 12.0–15.0)
MCH: 28.1 pg (ref 26.0–34.0)
MCHC: 31.9 g/dL (ref 30.0–36.0)
MCV: 88.1 fL (ref 80.0–100.0)
Platelets: 289 10*3/uL (ref 150–400)
RBC: 4.02 MIL/uL (ref 3.87–5.11)
RDW: 15.7 % — ABNORMAL HIGH (ref 11.5–15.5)
WBC: 8.2 10*3/uL (ref 4.0–10.5)
nRBC: 0 % (ref 0.0–0.2)

## 2023-06-27 LAB — BASIC METABOLIC PANEL
Anion gap: 8 (ref 5–15)
BUN: 15 mg/dL (ref 8–23)
CO2: 23 mmol/L (ref 22–32)
Calcium: 8.6 mg/dL — ABNORMAL LOW (ref 8.9–10.3)
Chloride: 105 mmol/L (ref 98–111)
Creatinine, Ser: 0.55 mg/dL (ref 0.44–1.00)
GFR, Estimated: >60 mL/min (ref >60)
Glucose, Bld: 125 mg/dL — ABNORMAL HIGH (ref 70–99)
Potassium: 4.1 mmol/L (ref 3.5–5.1)
Sodium: 136 mmol/L (ref 135–145)

## 2023-06-27 LAB — TROPONIN I (HIGH SENSITIVITY): Troponin I (High Sensitivity): 6 ng/L (ref <18)

## 2023-06-27 LAB — BRAIN NATRIURETIC PEPTIDE: B Natriuretic Peptide: 39.6 pg/mL (ref 0.0–100.0)

## 2023-06-27 LAB — CK: Total CK: 85 U/L (ref 38–234)

## 2023-06-27 MED ORDER — SODIUM CHLORIDE 0.9 % IV BOLUS: 500.0000 mL | Freq: Once | INTRAVENOUS | Status: DC

## 2023-06-27 MED ORDER — DIAZEPAM 5 MG/ML IJ SOLN
2.0000 mg | Freq: Once | INTRAMUSCULAR | Status: AC
  Administered 2023-06-28: 2 mg via INTRAVENOUS
  Filled 2023-06-27: qty 2

## 2023-06-27 NOTE — ED Triage Notes (Signed)
Pt to ED For generalized edema noticed yesterday. States just got off cruise and has been using scope patches, reports still feels like sea sick.  Reports gained 15 pounds on cruise in 5 days
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
## 2023-06-28 ENCOUNTER — Other Ambulatory Visit: Payer: Self-pay

## 2023-06-28 ENCOUNTER — Emergency Department: Payer: 59

## 2023-06-28 DIAGNOSIS — R2243 Localized swelling, mass and lump, lower limb, bilateral: Secondary | ICD-10-CM | POA: Diagnosis not present

## 2023-06-28 LAB — RESP PANEL BY RT-PCR (RSV, FLU A&B, COVID)  RVPGX2
Influenza A by PCR: NEGATIVE
Influenza B by PCR: NEGATIVE
Resp Syncytial Virus by PCR: NEGATIVE
SARS Coronavirus 2 by RT PCR: NEGATIVE

## 2023-06-28 MED ORDER — T.E.D. KNEE LENGTH/XL-LONG MISC: 2.0000 | Freq: Every day | 0 refills | Status: AC | PRN

## 2023-06-28 MED ORDER — IOHEXOL 350 MG/ML SOLN
100.0000 mL | Freq: Once | INTRAVENOUS | Status: AC | PRN
  Administered 2023-06-28: 100 mL via INTRAVENOUS

## 2023-06-28 NOTE — ED Notes (Signed)
RN Lanora Manis advised to put pt on 2L of oxygen. Pt PO went up from 93 to 99.

## 2023-06-28 NOTE — Discharge Instructions (Signed)
Wear TED hose during the day.  Elevate your legs at night while you sleep.  Limit salt intake.  Return to the ER for worsening symptoms, persistent vomiting, chest pain, shortness of breath or other concerns.

## 2023-07-03 ENCOUNTER — Encounter: Payer: Self-pay | Admitting: Gastroenterology

## 2023-07-17 ENCOUNTER — Encounter: Payer: Self-pay | Admitting: Gastroenterology

## 2023-07-24 ENCOUNTER — Ambulatory Visit: Payer: 59 | Admitting: Registered Nurse

## 2023-07-24 ENCOUNTER — Encounter: Payer: Self-pay | Admitting: Gastroenterology

## 2023-07-24 ENCOUNTER — Other Ambulatory Visit: Payer: Self-pay

## 2023-07-24 ENCOUNTER — Encounter
Admission: RE | Disposition: A | Discharge status: Home / Self Care | Payer: Self-pay | Source: Home / Self Care | Attending: Gastroenterology

## 2023-07-24 ENCOUNTER — Ambulatory Visit
Admission: RE | Admit: 2023-07-24 | Discharge: 2023-07-24 | Disposition: A | Discharge status: Home / Self Care | Payer: 59 | Attending: Gastroenterology | Admitting: Gastroenterology

## 2023-07-24 DIAGNOSIS — K269 Duodenal ulcer, unspecified as acute or chronic, without hemorrhage or perforation: Secondary | ICD-10-CM | POA: Diagnosis not present

## 2023-07-24 DIAGNOSIS — K2970 Gastritis, unspecified, without bleeding: Secondary | ICD-10-CM | POA: Insufficient documentation

## 2023-07-24 DIAGNOSIS — K2100 Gastro-esophageal reflux disease with esophagitis, without bleeding: Secondary | ICD-10-CM | POA: Diagnosis not present

## 2023-07-24 DIAGNOSIS — I10 Essential (primary) hypertension: Secondary | ICD-10-CM | POA: Insufficient documentation

## 2023-07-24 DIAGNOSIS — K641 Second degree hemorrhoids: Secondary | ICD-10-CM | POA: Insufficient documentation

## 2023-07-24 DIAGNOSIS — K529 Noninfective gastroenteritis and colitis, unspecified: Secondary | ICD-10-CM | POA: Insufficient documentation

## 2023-07-24 DIAGNOSIS — Z800 Family history of malignant neoplasm of digestive organs: Secondary | ICD-10-CM | POA: Diagnosis not present

## 2023-07-24 DIAGNOSIS — K2289 Other specified disease of esophagus: Secondary | ICD-10-CM | POA: Insufficient documentation

## 2023-07-24 DIAGNOSIS — K5730 Diverticulosis of large intestine without perforation or abscess without bleeding: Secondary | ICD-10-CM | POA: Diagnosis not present

## 2023-07-24 HISTORY — DX: Hypothyroidism, unspecified: E03.9

## 2023-07-24 HISTORY — PX: ESOPHAGOGASTRODUODENOSCOPY (EGD) WITH PROPOFOL: SHX5813

## 2023-07-24 HISTORY — DX: Pure hypercholesterolemia, unspecified: E78.00

## 2023-07-24 HISTORY — DX: Urge incontinence: N39.41

## 2023-07-24 HISTORY — DX: Personal history of urinary calculi: Z87.442

## 2023-07-24 HISTORY — DX: Sciatica, unspecified side: M54.30

## 2023-07-24 HISTORY — DX: Post-traumatic stress disorder, unspecified: F43.10

## 2023-07-24 HISTORY — DX: Fatty (change of) liver, not elsewhere classified: K76.0

## 2023-07-24 HISTORY — DX: Radiculopathy, lumbosacral region: M54.17

## 2023-07-24 HISTORY — DX: Other intervertebral disc degeneration, lumbar region without mention of lumbar back pain or lower extremity pain: M51.369

## 2023-07-24 HISTORY — DX: Bipolar disorder, current episode manic without psychotic features, unspecified: F31.10

## 2023-07-24 HISTORY — DX: Restless legs syndrome: G25.81

## 2023-07-24 HISTORY — DX: Exposure of implanted vaginal mesh into vagina, initial encounter: T83.721A

## 2023-07-24 HISTORY — DX: Primary osteoarthritis, unspecified shoulder: M19.019

## 2023-07-24 HISTORY — PX: COLONOSCOPY WITH PROPOFOL: SHX5780

## 2023-07-24 HISTORY — PX: POLYPECTOMY: SHX5525

## 2023-07-24 HISTORY — DX: Lichen sclerosus et atrophicus: L90.0

## 2023-07-24 HISTORY — DX: Calculus of kidney: N20.0

## 2023-07-24 HISTORY — PX: BIOPSY: SHX5522

## 2023-07-24 LAB — GLUCOSE, CAPILLARY: Glucose-Capillary: 123 mg/dL — ABNORMAL HIGH (ref 70–99)

## 2023-07-24 SURGERY — COLONOSCOPY WITH PROPOFOL
Anesthesia: General

## 2023-07-24 MED ORDER — GLYCOPYRROLATE 0.2 MG/ML IJ SOLN
INTRAMUSCULAR | Status: DC | PRN
  Administered 2023-07-24: .2 mg via INTRAVENOUS

## 2023-07-24 MED ORDER — GLYCOPYRROLATE 0.2 MG/ML IJ SOLN
INTRAMUSCULAR | Status: AC
  Filled 2023-07-24: qty 1

## 2023-07-24 MED ORDER — LIDOCAINE HCL (CARDIAC) PF 100 MG/5ML IV SOSY
PREFILLED_SYRINGE | INTRAVENOUS | Status: DC | PRN
  Administered 2023-07-24: 100 mg via INTRAVENOUS

## 2023-07-24 MED ORDER — PROPOFOL 500 MG/50ML IV EMUL
INTRAVENOUS | Status: DC | PRN
  Administered 2023-07-24: 125 ug/kg/min via INTRAVENOUS

## 2023-07-24 MED ORDER — SODIUM CHLORIDE 0.9 % IV SOLN: INTRAVENOUS | Status: DC

## 2023-07-24 MED ORDER — STERILE WATER FOR IRRIGATION IR SOLN
Status: DC | PRN
  Administered 2023-07-24: 120 mL

## 2023-07-24 MED ORDER — PROPOFOL 10 MG/ML IV BOLUS
INTRAVENOUS | Status: DC | PRN
  Administered 2023-07-24: 100 mg via INTRAVENOUS
  Administered 2023-07-24: 50 mg via INTRAVENOUS
  Administered 2023-07-24 (×2): 30 mg via INTRAVENOUS
  Administered 2023-07-24: 50 mg via INTRAVENOUS

## 2023-07-24 NOTE — Op Note (Signed)
[facility] Patient Name: [name] Procedure Date: 07/24/2023 10:18 AM MRN: [id number] Account #: [id number] Date of Birth: 1956/04/27 Admit Type: Outpatient Age: 67 Room: [facility] ENDO ROOM 1 Gender: Female Note Status: Finalized Instrument Name: Colonoscope [id number] Procedure:             Colonoscopy Indications:           Chronic diarrhea Providers:             Jaynie Collins DO, DO Referring MD:          Lurlean Leyden A. Greggory Stallion MD, MD (Referring MD) Medicines:             Monitored Anesthesia Care Complications:         No immediate complications. Estimated blood loss:                         Minimal. Procedure:             Pre-Anesthesia Assessment:                        - Prior to the procedure, a History and Physical was                         performed, and patient medications and allergies were                         reviewed. The patient is competent. The risks and                         benefits of the procedure and the sedation options and                         risks were discussed with the patient. All questions                         were answered and informed consent was obtained.                         Patient identification and proposed procedure were                         verified by the physician, the nurse, the anesthetist                         and the technician in the endoscopy suite. Mental                         Status Examination: alert and oriented. Airway                         Examination: normal oropharyngeal airway and neck                         mobility. Respiratory Examination: clear to                         auscultation. CV Examination: RRR, no murmurs, no S3  or S4. Prophylactic Antibiotics: The patient does not                         require prophylactic antibiotics. Prior                         Anticoagulants: The patient has taken no anticoagulant                          or antiplatelet agents. ASA Grade Assessment: III - A                         patient with severe systemic disease. After reviewing                         the risks and benefits, the patient was deemed in                         satisfactory condition to undergo the procedure. The                         anesthesia plan was to use monitored anesthesia care                         (MAC). Immediately prior to administration of                         medications, the patient was re-assessed for adequacy                         to receive sedatives. The heart rate, respiratory                         rate, oxygen saturations, blood pressure, adequacy of                         pulmonary ventilation, and response to care were                         monitored throughout the procedure. The physical                         status of the patient was re-assessed after the                         procedure.                        After obtaining informed consent, the colonoscope was                         passed under direct vision. Throughout the procedure,                         the patient's blood pressure, pulse, and oxygen                         saturations were monitored continuously. The  Colonoscope was introduced through the anus and                         advanced to the the terminal ileum, with                         identification of the appendiceal orifice and IC                         valve. The colonoscopy was performed without                         difficulty. The patient tolerated the procedure well.                         The quality of the bowel preparation was evaluated                         using the BBPS [facility] Bowel Preparation Scale) with                         scores of: Right Colon = 2 (minor amount of residual                         staining, small fragments of stool and/or opaque                         liquid, but mucosa seen  well), Transverse Colon = 2                         (minor amount of residual staining, small fragments of                         stool and/or opaque liquid, but mucosa seen well) and                         Left Colon = 2 (minor amount of residual staining,                         small fragments of stool and/or opaque liquid, but                         mucosa seen well). The total BBPS score equals 6. The                         quality of the bowel preparation was good. The                         terminal ileum, ileocecal valve, appendiceal orifice,                         and rectum were photographed. Findings:      Hemorrhoids were found on perianal exam.      The digital rectal exam was normal. Pertinent negatives include normal       sphincter tone.      The terminal ileum appeared normal. Estimated blood loss: none.  Normal mucosa was found in the entire colon. Biopsies for histology were       taken with a cold forceps from the right colon and left colon for       evaluation of microscopic colitis. Estimated blood loss was minimal.      Multiple small-mouthed diverticula were found in the recto-sigmoid colon       and sigmoid colon. Estimated blood loss: none.      Non-bleeding internal hemorrhoids were found during retroflexion. The       hemorrhoids were Grade II (internal hemorrhoids that prolapse but reduce       spontaneously). Estimated blood loss: none.      Two sessile polyps were found in the sigmoid colon. The polyps were 1 to       2 mm in size. These polyps were removed with a jumbo cold forceps.       Resection and retrieval were complete. Estimated blood loss was minimal.      The exam was otherwise without abnormality on direct and retroflexion       views. Impression:            - Hemorrhoids found on perianal exam.                        - The examined portion of the ileum was normal.                        - Normal mucosa in the entire examined colon.  Biopsied.                        - Diverticulosis in the recto-sigmoid colon and in the                         sigmoid colon.                        - Non-bleeding internal hemorrhoids.                        - Two 1 to 2 mm polyps in the sigmoid colon, removed                         with a jumbo cold forceps. Resected and retrieved.                        - The examination was otherwise normal on direct and                         retroflexion views. Recommendation:        - Patient has a contact number available for                         emergencies. The signs and symptoms of potential                         delayed complications were discussed with the patient.                         Return to normal activities tomorrow. Written  discharge instructions were provided to the patient.                        - Discharge patient to home.                        - Resume previous diet.                        - Continue present medications.                        - Await pathology results.                        - Repeat colonoscopy for surveillance based on                         pathology results.                        - Return to GI office as previously scheduled.                        - The findings and recommendations were discussed with                         the patient. Procedure Code(s):     --- Professional ---                        [phone], Colonoscopy, flexible; with biopsy, single or                         multiple Diagnosis Code(s):     --- Professional ---                        K64.1, Second degree hemorrhoids                        D12.5, Benign neoplasm of sigmoid colon                        K52.9, Noninfective gastroenteritis and colitis,                         unspecified                        K57.30, Diverticulosis of large intestine without                         perforation or abscess without bleeding CPT copyright 2022 American  Medical Association. All rights reserved. The codes documented in this report are preliminary and upon coder review may  be revised to meet current compliance requirements. Attending Participation:      I personally performed the entire procedure. Elfredia Nevins, DO Jaynie Collins DO, DO 07/24/2023 12:53:48 PM This report has been signed electronically. Number of Addenda: 0 Note Initiated On: 07/24/2023 10:18 AM Scope Withdrawal Time: 0 hours 13 minutes 4 seconds  Total Procedure Duration: 0 hours 18 minutes 2 seconds  Estimated Blood Loss:  Estimated blood loss was minimal.  [facility]

## 2023-07-24 NOTE — H&P (Signed)
Pre-Procedure H&P   Patient ID: [name] is a 67 y.o. female.  Gastroenterology Provider: Jaynie Collins, DO  Referring Provider: Tawni Pummel, PA PCP: Rayetta Humphrey, MD  Date: 07/24/2023  HPI [name] is a 67 y.o. female who presents today for Esophagogastroduodenoscopy and Colonoscopy for GERD, nausea, vomiting, diarrhea .  Patient diagnosed with E. coli and norovirus in June with diarrheal episode.  This improved after antibiotics, however, she still is having 2-4 loose urgent stools per day.  She denies melena and hematochezia.  There is also abdominal discomfort that is not associated with defecation.  Currently on p.o. somatotype which has been held for this procedure  Reflux symptoms are controlled with PPI.  No dysphagia or odynophagia.  Appetite improved after her acute illness. Avoiding nsaids  Last EGD and colonoscopy May 2019 demonstrating H. pylori status posttreatment now.  Celiac biopsies negative.  Barrett's esophagus also demonstrated.  Internal and external hemorrhoids present, normal TI, left-sided diverticulosis and 1 adenomatous polyp.  EGD and colonoscopy in 2015 demonstrating Barrett's esophagus and sessile serrated polyp  Mother with a history of colon cancer  Past Medical History:  Diagnosis Date   Anxiety    Arthritis    Barrett esophagus    Barrett's esophagus    Bipolar affective disorder, manic (HCC)    Bipolar disorder (HCC)    DDD (degenerative disc disease), lumbar    Depression    Diabetes mellitus without complication (HCC)    Exposure of vaginal mesh through vaginal wall (HCC)    Fatty liver    GERD (gastroesophageal reflux disease)    Glenohumeral arthritis    History of kidney stones    Hypercholesterolemia    Hypercholesterolemia    Hypertension    Hypothyroidism    Lichen sclerosus    Nephrolithiasis    PTSD (post-traumatic stress disorder)    Radiculopathy of lumbosacral region    RLS (restless  legs syndrome)    Sciatica    Thyroid disease    Urge urinary incontinence     Past Surgical History:  Procedure Laterality Date   BLADDER SUSPENSION  2009   BREAST EXCISIONAL BIOPSY Left 04/11/2008   neg   BREAST SURGERY     CESAREAN SECTION     COLONOSCOPY WITH PROPOFOL N/A 12/23/2017   Procedure: COLONOSCOPY WITH PROPOFOL;  Surgeon: Christena Deem, MD;  Location: [facility] ENDOSCOPY;  Service: Endoscopy;  Laterality: N/A;   CYSTOCELE REPAIR     CYSTOURETHROSCOPY     ESOPHAGOGASTRODUODENOSCOPY (EGD) WITH PROPOFOL N/A 12/23/2017   Procedure: ESOPHAGOGASTRODUODENOSCOPY (EGD) WITH PROPOFOL;  Surgeon: Christena Deem, MD;  Location: [facility] ENDOSCOPY;  Service: Endoscopy;  Laterality: N/A;   HERNIA REPAIR     JOINT REPLACEMENT     LAPAROSCOPIC SALPINGOOPHERECTOMY Left 1976   MENISCECTOMY Bilateral 1980, 2000   OOPHORECTOMY Left    PERCUTANEOUS NEPHROSTOLITHOTOMY     REPLACEMENT TOTAL KNEE Left 2010   TUBAL LIGATION      Family History Mother with a history of colon cancer No other h/o GI disease or malignancy  Review of Systems  Constitutional:  Negative for activity change, appetite change, chills, diaphoresis, fatigue, fever and unexpected weight change.  HENT:  Negative for trouble swallowing and voice change.   Respiratory:  Negative for shortness of breath and wheezing.   Cardiovascular:  Negative for chest pain, palpitations and leg swelling.  Gastrointestinal:  Positive for abdominal pain, diarrhea, nausea and vomiting. Negative for abdominal distention, anal bleeding,  blood in stool, constipation and rectal pain.  Musculoskeletal:  Negative for arthralgias and myalgias.  Skin:  Negative for color change and pallor.  Neurological:  Negative for dizziness, syncope and weakness.  Psychiatric/Behavioral:  Negative for confusion.   All other systems reviewed and are negative.    Medications No current facility-administered medications on file prior to encounter.    Current Outpatient Medications on File Prior to Encounter  Medication Sig Dispense Refill   cetirizine (ZYRTEC) 10 MG tablet Take 10 mg by mouth daily.     levothyroxine (SYNTHROID, LEVOTHROID) 175 MCG tablet Take 175 mcg by mouth daily.     losartan (COZAAR) 50 MG tablet Take 50 mg by mouth daily.     magnesium oxide (MAG-OX) 400 (240 Mg) MG tablet Take 400 mg by mouth daily.     Semaglutide 7 MG TABS Take 1 tablet by mouth daily.     tamsulosin (FLOMAX) 0.4 MG CAPS capsule Take 0.4 mg by mouth.     Vibegron 75 MG TABS Take 75 mg by mouth daily.     acetaminophen (TYLENOL) 500 MG tablet Take 500 mg by mouth every 6 (six) hours as needed.     albuterol (PROVENTIL HFA;VENTOLIN HFA) 108 (90 Base) MCG/ACT inhaler Inhale 2 puffs into the lungs every 6 (six) hours as needed for wheezing or shortness of breath. 1 Inhaler 0   carvedilol (COREG) 6.25 MG tablet Take 1 tablet by mouth 2 (two) times daily. (Patient not taking: Reported on 07/24/2023)     clotrimazole-betamethasone (LOTRISONE) cream Apply to affected area 2 times daily prn 15 g 0   DULoxetine (CYMBALTA) 30 MG capsule Take 30 mg by mouth daily. (Patient not taking: Reported on 07/24/2023)     esomeprazole (NEXIUM) 40 MG capsule Take 1 capsule by mouth daily as needed (GERD or heartburn).  (Patient not taking: Reported on 07/24/2023)     fesoterodine (TOVIAZ) 8 MG TB24 tablet Take by mouth. (Patient not taking: Reported on 07/24/2023)     gabapentin (NEURONTIN) 300 MG capsule Take 300 mg by mouth 3 (three) times daily. (Patient not taking: Reported on 07/24/2023)     hydrocortisone (PROCTOCORT) 1 % CREA Apply 1 application topically 2 (two) times daily. 30 g 0   levothyroxine (SYNTHROID) 200 MCG tablet Take by mouth.     levothyroxine (SYNTHROID) 25 MCG tablet Take by mouth.     loratadine-pseudoephedrine (CLARITIN-D 12-HOUR) 5-120 MG tablet Take 1 tablet by mouth 2 (two) times daily.     metFORMIN (GLUCOPHAGE-XR) 500 MG 24 hr tablet Take  1,000 mg by mouth every evening.     neomycin-polymyxin-hydrocortisone (CORTISPORIN) 3.5-10000-1 OTIC suspension Place 4 drops into the right ear 3 (three) times daily. 10 mL 0   oxybutynin (DITROPAN) 5 MG tablet Take 5 mg by mouth 3 (three) times daily. (Patient not taking: Reported on 07/24/2023)     phenazopyridine (PYRIDIUM) 200 MG tablet Take 1 tablet (200 mg total) by mouth 3 (three) times daily. (Patient not taking: Reported on 07/24/2023) 6 tablet 0   pravastatin (PRAVACHOL) 10 MG tablet Take 1 tablet by mouth every evening. (Patient not taking: Reported on 07/24/2023)     QUEtiapine (SEROQUEL) 25 MG tablet Take 1 tablet by mouth at bedtime as needed (sleep).  (Patient not taking: Reported on 07/24/2023)     scopolamine (TRANSDERM-SCOP) 1 MG/3DAYS Place 1 patch onto the skin every 3 (three) days. (Patient not taking: Reported on 07/24/2023)     sitaGLIPtin (JANUVIA) 100 MG tablet Take  100 mg by mouth daily. (Patient not taking: Reported on 07/24/2023)      Pertinent medications related to GI and procedure were reviewed by me with the patient prior to the procedure   Current Facility-Administered Medications:    0.9 %  sodium chloride infusion, , Intravenous, Continuous, Jaynie Collins, DO  sodium chloride         Allergies  Allergen Reactions   Chloraseptic Max Sore Throat  [Phenol-Glycerin] Anaphylaxis   Tramadol Other (See Comments)    Other Reaction: FELT DRUNK, NAUSEA & VOMITING   Benzocaine-Menthol Other (See Comments)    Throat swelling.   Haloperidol Decanoate Other (See Comments)    Other Reaction: LOCK JAW   Lisinopril    Allergies were reviewed by me prior to the procedure  Objective   Body mass index is 38.84 kg/m. Vitals:   07/24/23 0948  BP: (!) 162/87  Pulse: 70  Resp: 18  Temp: (!) 97 F (36.1 C)  TempSrc: Temporal  SpO2: 97%  Weight: 112.5 kg  Height: 5\' 7"  (1.702 m)     Physical Exam Vitals and nursing note reviewed.  Constitutional:       General: She is not in acute distress.    Appearance: Normal appearance. She is obese. She is not ill-appearing, toxic-appearing or diaphoretic.  HENT:     Head: Normocephalic and atraumatic.     Nose: Nose normal.     Mouth/Throat:     Mouth: Mucous membranes are moist.     Pharynx: Oropharynx is clear.  Eyes:     General: No scleral icterus.    Extraocular Movements: Extraocular movements intact.  Cardiovascular:     Rate and Rhythm: Normal rate and regular rhythm.     Heart sounds: Normal heart sounds. No murmur heard.    No friction rub. No gallop.  Pulmonary:     Effort: Pulmonary effort is normal. No respiratory distress.     Breath sounds: Normal breath sounds. No wheezing, rhonchi or rales.  Abdominal:     General: Bowel sounds are normal. There is no distension.     Palpations: Abdomen is soft.     Tenderness: There is no abdominal tenderness. There is no guarding or rebound.  Musculoskeletal:     Cervical back: Neck supple.     Right lower leg: No edema.     Left lower leg: No edema.  Skin:    General: Skin is warm and dry.     Coloration: Skin is not jaundiced or pale.  Neurological:     General: No focal deficit present.     Mental Status: She is alert and oriented to person, place, and time. Mental status is at baseline.  Psychiatric:        Mood and Affect: Mood normal.        Behavior: Behavior normal.        Thought Content: Thought content normal.        Judgment: Judgment normal.      Assessment:  [name] is a 67 y.o. female  who presents today for Colonoscopy for GERD, nausea, vomiting, diarrhea .  Plan:  Colonoscopy with possible intervention today  Colonoscopy with possible biopsy, control of bleeding, polypectomy, and interventions as necessary has been discussed with the patient/patient representative. Informed consent was obtained from the patient/patient representative after explaining the indication, nature, and risks of the  procedure including but not limited to death, bleeding, perforation, missed neoplasm/lesions, cardiorespiratory compromise, and reaction  to medications. Opportunity for questions was given and appropriate answers were provided. Patient/patient representative has verbalized understanding is amenable to undergoing the procedure.   Jaynie Collins, DO  [facility]  Portions of the record may have been created with voice recognition software. Occasional wrong-word or 'sound-a-like' substitutions may have occurred due to the inherent limitations of voice recognition software.  Read the chart carefully and recognize, using context, where substitutions may have occurred.

## 2023-07-24 NOTE — Interval H&P Note (Signed)
History and Physical Interval Note: Preprocedure H&P from 07/24/23  was reviewed and there was no interval change after seeing and examining the patient.  Written consent was obtained from the patient after discussion of risks, benefits, and alternatives. Patient has consented to proceed with Esophagogastroduodenoscopy and Colonoscopy with possible intervention   07/24/2023 10:24 AM  [name]  has presented today for surgery, with the diagnosis of 787.91 (ICD-9-CM) - R19.7 (ICD-10-CM) - Diarrhea, unspecified type 787.01 (ICD-9-CM) - R11.2 (ICD-10-CM) - Nausea and vomiting, unspecified vomiting type 530.81 (ICD-9-CM) - K21.9 (ICD-10-CM) - Gastroesophageal reflux disease, unspecified whether esophagitis present.  The various methods of treatment have been discussed with the patient and family. After consideration of risks, benefits and other options for treatment, the patient has consented to  Procedure(s): COLONOSCOPY WITH PROPOFOL (N/A) ESOPHAGOGASTRODUODENOSCOPY (EGD) WITH PROPOFOL (N/A) as a surgical intervention.  The patient's history has been reviewed, patient examined, no change in status, stable for surgery.  I have reviewed the patient's chart and labs.  Questions were answered to the patient's satisfaction.     Jaynie Collins

## 2023-07-24 NOTE — Anesthesia Preprocedure Evaluation (Signed)
Anesthesia Evaluation  Patient identified by MRN, date of birth, ID band Patient awake    Reviewed: Allergy & Precautions, H&P , NPO status , Patient's Chart, lab work & pertinent test results, reviewed documented beta blocker date and time   History of Anesthesia Complications Negative for: history of anesthetic complications  Airway Mallampati: IV  TM Distance: >3 FB Neck ROM: full    Dental  (+) Edentulous Upper, Edentulous Lower, Dental Advidsory Given   Pulmonary neg pulmonary ROS, former smoker   Pulmonary exam normal breath sounds clear to auscultation       Cardiovascular Exercise Tolerance: Good hypertension, (-) angina (-) Past MI and (-) Cardiac Stents Normal cardiovascular exam(-) dysrhythmias (-) Valvular Problems/Murmurs Rhythm:regular Rate:Normal     Neuro/Psych neg Seizures PSYCHIATRIC DISORDERS Anxiety Depression Bipolar Disorder    Neuromuscular disease    GI/Hepatic ,GERD  ,,NAFLD   Endo/Other  diabetesHypothyroidism  Class 3 obesity  Renal/GU Renal disease (kidney stones)  negative genitourinary   Musculoskeletal   Abdominal   Peds  Hematology negative hematology ROS (+)   Anesthesia Other Findings Past Medical History: No date: Anxiety No date: Arthritis No date: Barrett esophagus No date: Barrett's esophagus No date: Bipolar affective disorder, manic (HCC) No date: Bipolar disorder (HCC) No date: DDD (degenerative disc disease), lumbar No date: Depression No date: Diabetes mellitus without complication (HCC) No date: Exposure of vaginal mesh through vaginal wall (HCC) No date: Fatty liver No date: GERD (gastroesophageal reflux disease) No date: Glenohumeral arthritis No date: History of kidney stones No date: Hypercholesterolemia No date: Hypercholesterolemia No date: Hypertension No date: Hypothyroidism No date: Lichen sclerosus No date: Nephrolithiasis No date: PTSD  (post-traumatic stress disorder) No date: Radiculopathy of lumbosacral region No date: RLS (restless legs syndrome) No date: Sciatica No date: Thyroid disease No date: Urge urinary incontinence   Reproductive/Obstetrics negative OB ROS                             Anesthesia Physical Anesthesia Plan  ASA: 3  Anesthesia Plan: General   Post-op Pain Management:    Induction: Intravenous  PONV Risk Score and Plan: 3 and Propofol infusion and TIVA  Airway Management Planned: Natural Airway and Nasal Cannula  Additional Equipment:   Intra-op Plan:   Post-operative Plan:   Informed Consent: I have reviewed the patients History and Physical, chart, labs and discussed the procedure including the risks, benefits and alternatives for the proposed anesthesia with the patient or authorized representative who has indicated his/her understanding and acceptance.     Dental Advisory Given  Plan Discussed with: Anesthesiologist, CRNA and Surgeon  Anesthesia Plan Comments:        Anesthesia Quick Evaluation

## 2023-07-24 NOTE — Op Note (Signed)
[facility] Patient Name: [name] Procedure Date: 07/24/2023 10:19 AM MRN: [id number] Account #: [id number] Date of Birth: 1956/06/05 Admit Type: Outpatient Age: 67 Room: [facility] ENDO ROOM 1 Gender: Female Note Status: Finalized Instrument Name: Patton Salles Endoscope [id number] Procedure:             Upper GI endoscopy Indications:           Barrett's esophagus Providers:             Jaynie Collins DO, DO Referring MD:          Lurlean Leyden A. Greggory Stallion MD, MD (Referring MD) Medicines:             Monitored Anesthesia Care Complications:         No immediate complications. Estimated blood loss:                         Minimal. Procedure:             Pre-Anesthesia Assessment:                        - Prior to the procedure, a History and Physical was                         performed, and patient medications and allergies were                         reviewed. The patient is competent. The risks and                         benefits of the procedure and the sedation options and                         risks were discussed with the patient. All questions                         were answered and informed consent was obtained.                         Patient identification and proposed procedure were                         verified by the physician, the nurse, the anesthetist                         and the technician in the endoscopy suite. Mental                         Status Examination: alert and oriented. Airway                         Examination: normal oropharyngeal airway and neck                         mobility. Respiratory Examination: clear to                         auscultation. CV Examination: RRR, no murmurs, no S3  or S4. Prophylactic Antibiotics: The patient does not                         require prophylactic antibiotics. Prior                         Anticoagulants: The patient has taken no anticoagulant                          or antiplatelet agents. ASA Grade Assessment: III - A                         patient with severe systemic disease. After reviewing                         the risks and benefits, the patient was deemed in                         satisfactory condition to undergo the procedure. The                         anesthesia plan was to use monitored anesthesia care                         (MAC). Immediately prior to administration of                         medications, the patient was re-assessed for adequacy                         to receive sedatives. The heart rate, respiratory                         rate, oxygen saturations, blood pressure, adequacy of                         pulmonary ventilation, and response to care were                         monitored throughout the procedure. The physical                         status of the patient was re-assessed after the                         procedure.                        After obtaining informed consent, the endoscope was                         passed under direct vision. Throughout the procedure,                         the patient's blood pressure, pulse, and oxygen                         saturations were monitored continuously. The Endoscope  was introduced through the mouth, and advanced to the                         second part of duodenum. The upper GI endoscopy was                         accomplished without difficulty. The patient tolerated                         the procedure well. Findings:      Three non-bleeding superficial duodenal ulcers with a clean ulcer base       (Forrest Class III) were found in the second portion of the duodenum.       The largest lesion was 2 mm in largest dimension. Biopsies were taken       with a cold forceps for histology. Estimated blood loss was minimal.      The exam of the duodenum was otherwise normal.      Scattered mild inflammation  characterized by adherent blood and erythema       was found in the gastric antrum. Biopsies were taken with a cold forceps       for Helicobacter pylori testing. Estimated blood loss was minimal.      The exam of the stomach was otherwise normal.      The Z-line was variable. Biopsies were taken with a cold forceps for       histology. Reported history of barretts. biopsies taken in 4 quadrant       fashion. Estimated blood loss was minimal. Imaging was performed using       white light and narrow band imaging to visualize the mucosa.      Esophagogastric landmarks were identified: the gastroesophageal junction       was found at 39 cm from the incisors.      The exam of the esophagus was otherwise normal. Impression:            - Non-bleeding duodenal ulcers with a clean ulcer base                         (Forrest Class III). Biopsied.                        - Gastritis. Biopsied.                        - Z-line variable. Biopsied.                        - Esophagogastric landmarks identified. Recommendation:        - Patient has a contact number available for                         emergencies. The signs and symptoms of potential                         delayed complications were discussed with the patient.                         Return to normal activities tomorrow. Written  discharge instructions were provided to the patient.                        - Discharge patient to home.                        - Resume previous diet.                        - Continue present medications.                        - No ibuprofen, naproxen, or other non-steroidal                         anti-inflammatory drugs for 5 days.                        - Await pathology results.                        - Return to GI clinic as previously scheduled.                        - The findings and recommendations were discussed with                         the patient. Procedure Code(s):      --- Professional ---                        [phone], Esophagogastroduodenoscopy, flexible,                         transoral; with biopsy, single or multiple Diagnosis Code(s):     --- Professional ---                        K26.9, Duodenal ulcer, unspecified as acute or                         chronic, without hemorrhage or perforation                        K29.70, Gastritis, unspecified, without bleeding                        K22.89, Other specified disease of esophagus                        K22.70, Barrett's esophagus without dysplasia CPT copyright 2022 [employer]. All rights reserved. The codes documented in this report are preliminary and upon coder review may  be revised to meet current compliance requirements. Attending Participation:      I personally performed the entire procedure. Elfredia Nevins, DO Jaynie Collins DO, DO 07/24/2023 12:27:21 PM This report has been signed electronically. Number of Addenda: 0 Note Initiated On: 07/24/2023 10:19 AM Estimated Blood Loss:  Estimated blood loss was minimal.      [facility]

## 2023-07-24 NOTE — Transfer of Care (Signed)
Immediate Anesthesia Transfer of Care Note  Patient: [name]  Procedure(s) Performed: COLONOSCOPY WITH PROPOFOL ESOPHAGOGASTRODUODENOSCOPY (EGD) WITH PROPOFOL BIOPSY POLYPECTOMY  Patient Location: PACU  Anesthesia Type:General  Level of Consciousness: awake  Airway & Oxygen Therapy: Patient Spontanous Breathing  Post-op Assessment: Report given to RN and Post -op Vital signs reviewed and stable  Post vital signs: Reviewed and stable  Last Vitals:  Vitals Value Taken Time  BP 101/79 07/24/23 1253  Temp 35.8 C 07/24/23 1252  Pulse 84 07/24/23 1253  Resp 21 07/24/23 1253  SpO2 99 % 07/24/23 1253  Vitals shown include unfiled device data.  Last Pain:  Vitals:   07/24/23 1252  TempSrc: Temporal  PainSc: 0-No pain         Complications: There were no known notable events for this encounter.

## 2023-07-25 ENCOUNTER — Encounter: Payer: Self-pay | Admitting: Gastroenterology

## 2023-07-28 LAB — SURGICAL PATHOLOGY

## 2023-07-31 ENCOUNTER — Inpatient Hospital Stay: Admission: RE | Admit: 2023-07-31 | Payer: 59 | Source: Ambulatory Visit

## 2023-07-31 NOTE — Anesthesia Postprocedure Evaluation (Signed)
Anesthesia Post Note  Patient: [name]  Procedure(s) Performed: COLONOSCOPY WITH PROPOFOL ESOPHAGOGASTRODUODENOSCOPY (EGD) WITH PROPOFOL BIOPSY POLYPECTOMY  Patient location during evaluation: Endoscopy Anesthesia Type: General Level of consciousness: awake and alert Pain management: pain level controlled Vital Signs Assessment: post-procedure vital signs reviewed and stable Respiratory status: spontaneous breathing, nonlabored ventilation, respiratory function stable and patient connected to nasal cannula oxygen Cardiovascular status: blood pressure returned to baseline and stable Postop Assessment: no apparent nausea or vomiting Anesthetic complications: no   There were no known notable events for this encounter.   Last Vitals:  Vitals:   07/24/23 1303 07/24/23 1314  BP: 118/82 125/79  Pulse: 79 70  Resp: (!) 28 (!) 24  Temp:    SpO2: 100% 100%    Last Pain:  Vitals:   07/24/23 1303  TempSrc:   PainSc: 0-No pain                 Lenard Simmer

## 2023-08-21 ENCOUNTER — Ambulatory Visit
Admission: EM | Admit: 2023-08-21 | Discharge: 2023-08-21 | Disposition: A | Discharge status: Home / Self Care | Payer: 59 | Attending: Physician Assistant | Admitting: Physician Assistant

## 2023-08-21 ENCOUNTER — Telehealth: Payer: Self-pay

## 2023-08-21 DIAGNOSIS — N3000 Acute cystitis without hematuria: Secondary | ICD-10-CM | POA: Insufficient documentation

## 2023-08-21 DIAGNOSIS — G2581 Restless legs syndrome: Secondary | ICD-10-CM | POA: Insufficient documentation

## 2023-08-21 DIAGNOSIS — R252 Cramp and spasm: Secondary | ICD-10-CM | POA: Insufficient documentation

## 2023-08-21 DIAGNOSIS — I10 Essential (primary) hypertension: Secondary | ICD-10-CM | POA: Insufficient documentation

## 2023-08-21 LAB — BASIC METABOLIC PANEL
Anion gap: 7 (ref 5–15)
BUN: 19 mg/dL (ref 8–23)
CO2: 26 mmol/L (ref 22–32)
Calcium: 8.8 mg/dL — ABNORMAL LOW (ref 8.9–10.3)
Chloride: 102 mmol/L (ref 98–111)
Creatinine, Ser: 0.65 mg/dL (ref 0.44–1.00)
GFR, Estimated: >60 mL/min (ref >60)
Glucose, Bld: 104 mg/dL — ABNORMAL HIGH (ref 70–99)
Potassium: 4.6 mmol/L (ref 3.5–5.1)
Sodium: 135 mmol/L (ref 135–145)

## 2023-08-21 LAB — CBC WITH DIFFERENTIAL/PLATELET
Abs Immature Granulocytes: 0.03 10*3/uL (ref 0.00–0.07)
Basophils Absolute: 0 10*3/uL (ref 0.0–0.1)
Basophils Relative: 0 %
Eosinophils Absolute: 0.1 10*3/uL (ref 0.0–0.5)
Eosinophils Relative: 1 %
HCT: 34.7 % — ABNORMAL LOW (ref 36.0–46.0)
Hemoglobin: 11.4 g/dL — ABNORMAL LOW (ref 12.0–15.0)
Immature Granulocytes: 0 %
Lymphocytes Relative: 34 %
Lymphs Abs: 2.5 10*3/uL (ref 0.7–4.0)
MCH: 28.3 pg (ref 26.0–34.0)
MCHC: 32.9 g/dL (ref 30.0–36.0)
MCV: 86.1 fL (ref 80.0–100.0)
Monocytes Absolute: 0.5 10*3/uL (ref 0.1–1.0)
Monocytes Relative: 7 %
Neutro Abs: 4.3 10*3/uL (ref 1.7–7.7)
Neutrophils Relative %: 58 %
Platelets: 278 10*3/uL (ref 150–400)
RBC: 4.03 MIL/uL (ref 3.87–5.11)
RDW: 15.7 % — ABNORMAL HIGH (ref 11.5–15.5)
WBC: 7.4 10*3/uL (ref 4.0–10.5)
nRBC: 0 % (ref 0.0–0.2)

## 2023-08-21 LAB — URINALYSIS, W/ REFLEX TO CULTURE (INFECTION SUSPECTED)
Bilirubin Urine: NEGATIVE
Glucose, UA: NEGATIVE mg/dL
Hgb urine dipstick: NEGATIVE
Ketones, ur: NEGATIVE mg/dL
Nitrite: NEGATIVE
Protein, ur: NEGATIVE mg/dL
Specific Gravity, Urine: 1.02 (ref 1.005–1.030)
pH: 6.5 (ref 5.0–8.0)

## 2023-08-21 LAB — MAGNESIUM: Magnesium: 2 mg/dL (ref 1.7–2.4)

## 2023-08-21 MED ORDER — BACLOFEN 10 MG PO TABS: 10.0000 mg | ORAL_TABLET | Freq: Every evening | ORAL | 0 refills | Status: AC | PRN

## 2023-08-21 MED ORDER — AMOXICILLIN-POT CLAVULANATE 875-125 MG PO TABS: 1.0000 | ORAL_TABLET | Freq: Two times a day (BID) | ORAL | 0 refills | Status: AC

## 2023-08-21 NOTE — Discharge Instructions (Addendum)
 UTI: Based on either symptoms or urinalysis, you may have a urinary tract infection. We will send the urine for culture and call with results in a few days. Begin antibiotics at this time. Your symptoms should be much improved over the next 2-3 days. Increase rest and fluid intake. If for some reason symptoms are worsening or not improving after a couple of days or the urine culture determines the antibiotics you are taking will not treat the infection, the antibiotics may be changed. Return or go to ER for fever, back pain, worsening urinary pain, discharge, increased blood in urine. May take Tylenol  or Motrin OTC for pain relief or consider AZO if no contraindications   LEG CRAMPS: I'll call with results of your electrolytes. I will send a muscle relaxer if labs look ok. Increase fluids, walk, stretch, use heating pad.

## 2023-08-21 NOTE — Telephone Encounter (Signed)
 Called and spoke with the patient to give lab results and inform them of medication that was sent to the pharmacy. Pt verbalized understanding and had no further questions.

## 2023-08-21 NOTE — ED Triage Notes (Signed)
 Pt c/o possible bladder infection and Leg cramping x2-3days  Pt states that she is having lower leg swelling.   Pt has an appointment with her PCP on 09/10/23.   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   Pt denies vaginal odor or discharge.
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
## 2023-08-23 LAB — URINE CULTURE: Culture: 60000 — AB

## 2023-10-22 ENCOUNTER — Ambulatory Visit: Payer: 59 | Admitting: Dietician

## 2024-02-17 ENCOUNTER — Ambulatory Visit
Admission: EM | Admit: 2024-02-17 | Discharge: 2024-02-17 | Disposition: A | Discharge status: Home / Self Care | Attending: Emergency Medicine | Admitting: Emergency Medicine

## 2024-02-17 ENCOUNTER — Encounter: Payer: Self-pay | Admitting: Emergency Medicine

## 2024-02-17 DIAGNOSIS — J309 Allergic rhinitis, unspecified: Secondary | ICD-10-CM | POA: Diagnosis not present

## 2024-02-17 DIAGNOSIS — H6503 Acute serous otitis media, bilateral: Secondary | ICD-10-CM

## 2024-02-17 DIAGNOSIS — R29818 Other symptoms and signs involving the nervous system: Secondary | ICD-10-CM

## 2024-02-17 MED ORDER — FEXOFENADINE HCL 180 MG PO TABS: 180.0000 mg | ORAL_TABLET | Freq: Every day | ORAL | 1 refills | Status: AC

## 2024-02-17 MED ORDER — IPRATROPIUM BROMIDE 0.06 % NA SOLN: 2.0000 | Freq: Four times a day (QID) | NASAL | 12 refills | Status: DC

## 2024-02-17 NOTE — ED Triage Notes (Signed)
 Pt presents with a headache every morning for the past few weeks. She also c/o of sinus pressure and has taken OTC sinus medication. She also reports dizziness, but states its a chronic issue.

## 2024-02-17 NOTE — Discharge Instructions (Addendum)
 Start taking the Allegra  100 mg once daily for control of your allergy symptoms and your sinus pressure and congestion.  Continue to use your Flonase at nighttime, 2 squirts in each nostril to help with your nasal allergies.  You may also use the Atrovent  nasal spray, 2 squirts up each nostril every 6 hours, to help with nasal congestion.  I suspect that the headaches you are experiencing may be a result of your sleep apnea given that you snore loudly and that you have excessive daytime sleepiness.  You need to have a sleep study to determine if you have sleep apnea.  Contact your primary care office and asked them about a home sleep study.  [employer] is 1 company that you can utilize for your home sleep study.  If you develop any headache that comes on and stays on, have blurry vision or double vision, have an increase in dizziness that does not resolve, or forceful nausea vomiting you need to go to the ER for evaluation.
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
## 2024-02-24 ENCOUNTER — Ambulatory Visit: Payer: Self-pay | Admitting: Emergency Medicine

## 2024-02-24 ENCOUNTER — Ambulatory Visit
Admission: EM | Admit: 2024-02-24 | Discharge: 2024-02-24 | Disposition: A | Discharge status: Home / Self Care | Attending: Emergency Medicine | Admitting: Emergency Medicine

## 2024-02-24 DIAGNOSIS — N390 Urinary tract infection, site not specified: Secondary | ICD-10-CM | POA: Insufficient documentation

## 2024-02-24 LAB — URINALYSIS, W/ REFLEX TO CULTURE (INFECTION SUSPECTED)
Bilirubin Urine: NEGATIVE
Glucose, UA: NEGATIVE mg/dL
Ketones, ur: NEGATIVE mg/dL
Nitrite: NEGATIVE
Protein, ur: NEGATIVE mg/dL
Specific Gravity, Urine: <1.005 — ABNORMAL LOW (ref 1.005–1.030)
WBC, UA: >50 WBC/hpf (ref 0–5)
pH: 6 (ref 5.0–8.0)

## 2024-02-24 MED ORDER — CEFTRIAXONE SODIUM 1 G IJ SOLR
1.0000 g | Freq: Once | INTRAMUSCULAR | Status: AC
  Administered 2024-02-24: 1 g via INTRAMUSCULAR

## 2024-02-24 MED ORDER — CEFDINIR 300 MG PO CAPS: 300.0000 mg | ORAL_CAPSULE | Freq: Two times a day (BID) | ORAL | 0 refills | Status: DC

## 2024-02-24 MED ORDER — PHENAZOPYRIDINE HCL 200 MG PO TABS: 200.0000 mg | ORAL_TABLET | Freq: Three times a day (TID) | ORAL | 0 refills | Status: DC | PRN

## 2024-02-24 NOTE — ED Triage Notes (Signed)
 Sx x 1 week  UTI sx Urinary frequency Dysuria

## 2024-02-24 NOTE — ED Provider Notes (Signed)
 HPI  SUBJECTIVE:  [name] is a 68 y.o. female who presents with 1 week of dysuria, urgency, frequency, cloudy odorous urine, hematuria, right back pain and vaginal itching.  No nausea, vomiting, fevers, abdominal, pelvic pain, vaginal odor, discharge.  No recent antibiotics.  No antipyretic in the past 6 hours.  She states this is identical to previous UTIs.  She gets frequent UTIs because she has very loose stools.  She tried ibuprofen and rest without improvement in her symptoms.  Symptoms worse with urination and when she has diarrhea. She has a past medical history of peptic ulcer disease, diabetes, recurrent UTI, pyelonephritis, obstructing nephrolithiasis, hypercholesterolemia, hypertension, hypothyroidism, lichen sclerosis, breast cancer, rheumatoid arthritis, urge urinary incontinence and is status post cystocele repair.  Urology: [facility] urology.  Her last UTI was in February 2025 which grew out Klebsiella pneumonia susceptible to cephalosporins, Cipro, resistant to Macrobid and Bactrim.  She had another UTI urine culture grew out E. coli that was pansensitive except for Macrobid.  Past Medical History:  Diagnosis Date   Anxiety    Arthritis    Barrett esophagus    Barrett's esophagus    Bipolar affective disorder, manic (HCC)    Bipolar disorder (HCC)    DDD (degenerative disc disease), lumbar    Depression    Diabetes mellitus without complication (HCC)    Exposure of vaginal mesh through vaginal wall (HCC)    Fatty liver    GERD (gastroesophageal reflux disease)    Glenohumeral arthritis    History of kidney stones    Hypercholesterolemia    Hypercholesterolemia    Hypertension    Hypothyroidism    Lichen sclerosus    Nephrolithiasis    PTSD (post-traumatic stress disorder)    Radiculopathy of lumbosacral region    RLS (restless legs syndrome)    Sciatica    Thyroid disease    Urge urinary incontinence     Past Surgical History:  Procedure Laterality Date    BIOPSY  07/24/2023   Procedure: BIOPSY;  Surgeon: Onita Elspeth Sharper, DO;  Location: [facility] ENDOSCOPY;  Service: Gastroenterology;;   BLADDER SUSPENSION  2009   BREAST EXCISIONAL BIOPSY Left 04/11/2008   neg   BREAST SURGERY     CESAREAN SECTION     COLONOSCOPY WITH PROPOFOL  N/A 12/23/2017   Procedure: COLONOSCOPY WITH PROPOFOL ;  Surgeon: Gaylyn Gladis PENNER, MD;  Location: [facility] ENDOSCOPY;  Service: Endoscopy;  Laterality: N/A;   COLONOSCOPY WITH PROPOFOL  N/A 07/24/2023   Procedure: COLONOSCOPY WITH PROPOFOL ;  Surgeon: Onita Elspeth Sharper, DO;  Location: [facility] ENDOSCOPY;  Service: Gastroenterology;  Laterality: N/A;   CYSTOCELE REPAIR     CYSTOURETHROSCOPY     ESOPHAGOGASTRODUODENOSCOPY (EGD) WITH PROPOFOL  N/A 12/23/2017   Procedure: ESOPHAGOGASTRODUODENOSCOPY (EGD) WITH PROPOFOL ;  Surgeon: Gaylyn Gladis PENNER, MD;  Location: [facility] ENDOSCOPY;  Service: Endoscopy;  Laterality: N/A;   ESOPHAGOGASTRODUODENOSCOPY (EGD) WITH PROPOFOL  N/A 07/24/2023   Procedure: ESOPHAGOGASTRODUODENOSCOPY (EGD) WITH PROPOFOL ;  Surgeon: Onita Elspeth Sharper, DO;  Location: [facility] ENDOSCOPY;  Service: Gastroenterology;  Laterality: N/A;   HERNIA REPAIR     JOINT REPLACEMENT     LAPAROSCOPIC SALPINGOOPHERECTOMY Left 1976   MENISCECTOMY Bilateral 1980, 2000   OOPHORECTOMY Left    PERCUTANEOUS NEPHROSTOLITHOTOMY     POLYPECTOMY  07/24/2023   Procedure: POLYPECTOMY;  Surgeon: Onita Elspeth Sharper, DO;  Location: [facility] ENDOSCOPY;  Service: Gastroenterology;;   REPLACEMENT TOTAL KNEE Left 2010   TUBAL LIGATION      Family History  Problem Relation Age of Onset  Breast cancer Sister 25       half sister on fathers side   Breast cancer Maternal Grandmother    Breast cancer Niece     Social History   Tobacco Use   Smoking status: Former    Current packs/day: 0.50    Average packs/day: 0.5 packs/day for 10.0 years (5.0 ttl pk-yrs)    Types: Cigarettes   Smokeless tobacco: Never   Tobacco comments:    few  cigarettes per week  Vaping Use   Vaping status: Never Used  Substance Use Topics   Alcohol use: Not Currently    Comment: rarely   Drug use: Not Currently    Types: Marijuana    Comment: once in a while     Current Facility-Administered Medications:    cefTRIAXone  (ROCEPHIN ) injection 1 g, 1 g, Intramuscular, Once, Van Knee, MD  Current Outpatient Medications:    cefdinir  (OMNICEF ) 300 MG capsule, Take 1 capsule (300 mg total) by mouth 2 (two) times daily., Disp: 20 capsule, Rfl: 0   levothyroxine (SYNTHROID) 200 MCG tablet, Take by mouth., Disp: , Rfl:    losartan (COZAAR) 50 MG tablet, Take 50 mg by mouth daily., Disp: , Rfl:    metFORMIN (GLUCOPHAGE-XR) 500 MG 24 hr tablet, Take 1,000 mg by mouth every evening., Disp: , Rfl:    phenazopyridine  (PYRIDIUM ) 200 MG tablet, Take 1 tablet (200 mg total) by mouth 3 (three) times daily as needed for pain., Disp: 6 tablet, Rfl: 0   Vibegron 75 MG TABS, Take 75 mg by mouth daily., Disp: , Rfl:    acetaminophen  (TYLENOL ) 500 MG tablet, Take 500 mg by mouth every 6 (six) hours as needed., Disp: , Rfl:    albuterol  (PROVENTIL  HFA;VENTOLIN  HFA) 108 (90 Base) MCG/ACT inhaler, Inhale 2 puffs into the lungs every 6 (six) hours as needed for wheezing or shortness of breath., Disp: 1 Inhaler, Rfl: 0   baclofen  (LIORESAL ) 10 MG tablet, Take 1 tablet (10 mg total) by mouth at bedtime as needed., Disp: 15 each, Rfl: 0   carvedilol (COREG) 6.25 MG tablet, Take 1 tablet by mouth 2 (two) times daily. (Patient not taking: Reported on 07/24/2023), Disp: , Rfl:    clotrimazole -betamethasone  (LOTRISONE ) cream, Apply to affected area 2 times daily prn, Disp: 15 g, Rfl: 0   Elastic Bandages & Supports (T.E.D. KNEE LENGTH/XL-LONG) MISC, 2 each by Does not apply route daily as needed., Disp: 2 each, Rfl: 0   esomeprazole (NEXIUM) 40 MG capsule, Take 1 capsule by mouth daily as needed (GERD or heartburn).  (Patient not taking: Reported on 07/24/2023), Disp: ,  Rfl:    fesoterodine (TOVIAZ) 8 MG TB24 tablet, Take by mouth. (Patient not taking: Reported on 07/24/2023), Disp: , Rfl:    fexofenadine  (ALLEGRA ) 180 MG tablet, Take 1 tablet (180 mg total) by mouth daily., Disp: 30 tablet, Rfl: 1   gabapentin (NEURONTIN) 300 MG capsule, Take 300 mg by mouth 3 (three) times daily. (Patient not taking: Reported on 07/24/2023), Disp: , Rfl:    hydrocortisone  (PROCTOCORT ) 1 % CREA, Apply 1 application topically 2 (two) times daily., Disp: 30 g, Rfl: 0   levothyroxine (SYNTHROID) 25 MCG tablet, Take by mouth., Disp: , Rfl:    levothyroxine (SYNTHROID, LEVOTHROID) 175 MCG tablet, Take 175 mcg by mouth daily., Disp: , Rfl:    magnesium oxide (MAG-OX) 400 (240 Mg) MG tablet, Take 400 mg by mouth daily., Disp: , Rfl:    oxybutynin (DITROPAN) 5 MG tablet, Take 5  mg by mouth 3 (three) times daily. (Patient not taking: Reported on 07/24/2023), Disp: , Rfl:    pantoprazole (PROTONIX) 40 MG tablet, Take 1 tablet by mouth 2 (two) times daily., Disp: , Rfl:    pravastatin (PRAVACHOL) 10 MG tablet, Take 1 tablet by mouth every evening. (Patient not taking: Reported on 07/24/2023), Disp: , Rfl:    QUEtiapine (SEROQUEL) 25 MG tablet, Take 1 tablet by mouth at bedtime as needed (sleep).  (Patient not taking: Reported on 07/24/2023), Disp: , Rfl:    Semaglutide 7 MG TABS, Take 1 tablet by mouth daily., Disp: , Rfl:    sitaGLIPtin (JANUVIA) 100 MG tablet, Take 100 mg by mouth daily. (Patient not taking: Reported on 07/24/2023), Disp: , Rfl:    tamsulosin (FLOMAX) 0.4 MG CAPS capsule, Take 0.4 mg by mouth., Disp: , Rfl:   Allergies  Allergen Reactions   Chloraseptic Max Sore Throat  [Phenol-Glycerin] Anaphylaxis   Tramadol Other (See Comments)    Other Reaction: FELT DRUNK, NAUSEA & VOMITING   Benzocaine -Menthol Other (See Comments)    Throat swelling.   Haloperidol Decanoate Other (See Comments)    Other Reaction: LOCK JAW   Lisinopril      ROS  As noted in HPI.    Physical Exam  BP (!) 129/92 (BP Location: Left Arm)   Pulse 70   Temp 99.1 F (37.3 C) (Oral)   Resp 20   SpO2 94%   Constitutional: Well developed, well nourished, no acute distress Eyes:  EOMI, conjunctiva normal bilaterally HENT: Normocephalic, atraumatic,mucus membranes moist Respiratory: Normal inspiratory effort Cardiovascular: Normal rate GI: nondistended.  No suprapubic tenderness.  Bilateral flank tenderness. Back: Questionable very mild bilateral CVAT. skin: No rash, skin intact Musculoskeletal: no deformities Neurologic: Alert & oriented x 3, no focal neuro deficits Psychiatric: Speech and behavior appropriate   ED Course   Medications  cefTRIAXone  (ROCEPHIN ) injection 1 g (has no administration in time range)    Orders Placed This Encounter  Procedures   Urine Culture    Standing Status:   Standing    Number of Occurrences:   1   Urinalysis, w/ Reflex to Culture (Infection Suspected) -Urine, Clean Catch    Standing Status:   Standing    Number of Occurrences:   1    Specimen Source:   Urine, Clean Catch [76]    Release to patient:   Immediate    Results for orders placed or performed during the hospital encounter of 02/24/24 (from the past 24 hours)  Urinalysis, w/ Reflex to Culture (Infection Suspected) -Urine, Clean Catch     Status: Abnormal   Collection Time: 02/24/24  8:34 AM  Result Value Ref Range   Specimen Source URINE, CLEAN CATCH    Color, Urine YELLOW YELLOW   APPearance CLEAR CLEAR   Specific Gravity, Urine <1.005 (L) 1.005 - 1.030   pH 6.0 5.0 - 8.0   Glucose, UA NEGATIVE NEGATIVE mg/dL   Hgb urine dipstick TRACE (A) NEGATIVE   Bilirubin Urine NEGATIVE NEGATIVE   Ketones, ur NEGATIVE NEGATIVE mg/dL   Protein, ur NEGATIVE NEGATIVE mg/dL   Nitrite NEGATIVE NEGATIVE   Leukocytes,Ua LARGE (A) NEGATIVE   Squamous Epithelial / HPF 0-5 0 - 5 /HPF   WBC, UA >50 0 - 5 WBC/hpf   RBC / HPF 6-10 0 - 5 RBC/hpf   Bacteria, UA MANY (A) NONE  SEEN   No results found.  ED Clinical Impression  1. Complicated urinary tract infection  ED Assessment/Plan    Previous labs, records reviewed.  Additional history obtained, as noted in HPI  UA consistent with urinary tract infection with large leukocytes, many bacteria, pyuria and trace hematuria.  Sending this off a culture to confirm antibiotic choice.  Calculated creatinine clearance from labs done in July 2025 154 mL/min.  Concern for complicated urinary tract infection.  Doubt obstructing nephrolithiasis.  Will give Rocephin  1000 mg here.  Home with Omnicef  for 10 days.  Pyridium  200 mg 3 times daily for 2 days.  Continue pushing fluids.  Ibuprofen 400 mg, and 1000 mg of Tylenol  3 times daily as needed pain.  Follow-up with urology.  ER return precautions given.  Discussed labs,  MDM, treatment plan, and plan for follow-up with patient. Discussed sn/sx that should prompt return to the ED. patient agrees with plan.   Meds ordered this encounter  Medications   cefTRIAXone  (ROCEPHIN ) injection 1 g   cefdinir  (OMNICEF ) 300 MG capsule    Sig: Take 1 capsule (300 mg total) by mouth 2 (two) times daily.    Dispense:  20 capsule    Refill:  0   phenazopyridine  (PYRIDIUM ) 200 MG tablet    Sig: Take 1 tablet (200 mg total) by mouth 3 (three) times daily as needed for pain.    Dispense:  6 tablet    Refill:  0      *This clinic note was created using Scientist, clinical (histocompatibility and immunogenetics). Therefore, there may be occasional mistakes despite careful proofreading.  ?    Van Knee, MD 02/25/24 1616

## 2024-02-24 NOTE — Discharge Instructions (Signed)
 Finish the Omnicef , even if you feel better.  I given you 1000 g of Rocephin  here.  Pyridium  will help with your symptoms.  Push extra fluids to help flush your kidneys.  I have sent your urine off for culture and make sure that we have you on the correct antibiotic.  You may take ibuprofen 400 mg, 1000 mg of Tylenol  up to 3 times a day as needed for pain.  Please follow-up with your urologist as this is a recurring issue.  Go to the ER for the signs and symptoms we discussed

## 2024-02-25 ENCOUNTER — Emergency Department

## 2024-02-25 ENCOUNTER — Ambulatory Visit

## 2024-02-25 ENCOUNTER — Other Ambulatory Visit: Payer: Self-pay

## 2024-02-25 ENCOUNTER — Emergency Department
Admission: EM | Admit: 2024-02-25 | Discharge: 2024-02-25 | Disposition: A | Discharge status: Home / Self Care | Attending: Emergency Medicine | Admitting: Emergency Medicine

## 2024-02-25 ENCOUNTER — Encounter: Payer: Self-pay | Admitting: Emergency Medicine

## 2024-02-25 DIAGNOSIS — R0600 Dyspnea, unspecified: Secondary | ICD-10-CM | POA: Diagnosis not present

## 2024-02-25 DIAGNOSIS — R519 Headache, unspecified: Secondary | ICD-10-CM | POA: Insufficient documentation

## 2024-02-25 DIAGNOSIS — I110 Hypertensive heart disease with heart failure: Secondary | ICD-10-CM | POA: Diagnosis not present

## 2024-02-25 DIAGNOSIS — I509 Heart failure, unspecified: Secondary | ICD-10-CM | POA: Diagnosis not present

## 2024-02-25 DIAGNOSIS — R0602 Shortness of breath: Secondary | ICD-10-CM | POA: Diagnosis present

## 2024-02-25 DIAGNOSIS — R1011 Right upper quadrant pain: Secondary | ICD-10-CM | POA: Diagnosis not present

## 2024-02-25 DIAGNOSIS — N390 Urinary tract infection, site not specified: Secondary | ICD-10-CM | POA: Insufficient documentation

## 2024-02-25 LAB — URINALYSIS, ROUTINE W REFLEX MICROSCOPIC
Bilirubin Urine: NEGATIVE
Glucose, UA: NEGATIVE mg/dL
Hgb urine dipstick: NEGATIVE
Ketones, ur: NEGATIVE mg/dL
Nitrite: NEGATIVE
Protein, ur: NEGATIVE mg/dL
Specific Gravity, Urine: 1.015 (ref 1.005–1.030)
pH: 5 (ref 5.0–8.0)

## 2024-02-25 LAB — CBC WITH DIFFERENTIAL/PLATELET
Abs Immature Granulocytes: 0.03 K/uL (ref 0.00–0.07)
Basophils Absolute: 0 K/uL (ref 0.0–0.1)
Basophils Relative: 0 %
Eosinophils Absolute: 0.1 K/uL (ref 0.0–0.5)
Eosinophils Relative: 1 %
HCT: 36 % (ref 36.0–46.0)
Hemoglobin: 10.9 g/dL — ABNORMAL LOW (ref 12.0–15.0)
Immature Granulocytes: 0 %
Lymphocytes Relative: 26 %
Lymphs Abs: 2 K/uL (ref 0.7–4.0)
MCH: 27.3 pg (ref 26.0–34.0)
MCHC: 30.3 g/dL (ref 30.0–36.0)
MCV: 90.2 fL (ref 80.0–100.0)
Monocytes Absolute: 0.6 K/uL (ref 0.1–1.0)
Monocytes Relative: 7 %
Neutro Abs: 5 K/uL (ref 1.7–7.7)
Neutrophils Relative %: 66 %
Platelets: 314 K/uL (ref 150–400)
RBC: 3.99 MIL/uL (ref 3.87–5.11)
RDW: 15.9 % — ABNORMAL HIGH (ref 11.5–15.5)
WBC: 7.7 K/uL (ref 4.0–10.5)
nRBC: 0 % (ref 0.0–0.2)

## 2024-02-25 LAB — COMPREHENSIVE METABOLIC PANEL WITH GFR
ALT: 73 U/L — ABNORMAL HIGH (ref 0–44)
AST: 42 U/L — ABNORMAL HIGH (ref 15–41)
Albumin: 3.4 g/dL — ABNORMAL LOW (ref 3.5–5.0)
Alkaline Phosphatase: 98 U/L (ref 38–126)
Anion gap: 9 (ref 5–15)
BUN: 21 mg/dL (ref 8–23)
CO2: 28 mmol/L (ref 22–32)
Calcium: 9.5 mg/dL (ref 8.9–10.3)
Chloride: 100 mmol/L (ref 98–111)
Creatinine, Ser: 0.5 mg/dL (ref 0.44–1.00)
GFR, Estimated: >60 mL/min (ref >60)
Glucose, Bld: 162 mg/dL — ABNORMAL HIGH (ref 70–99)
Potassium: 4.4 mmol/L (ref 3.5–5.1)
Sodium: 137 mmol/L (ref 135–145)
Total Bilirubin: 0.6 mg/dL (ref 0.0–1.2)
Total Protein: 7.3 g/dL (ref 6.5–8.1)

## 2024-02-25 LAB — BRAIN NATRIURETIC PEPTIDE: B Natriuretic Peptide: 35.8 pg/mL (ref 0.0–100.0)

## 2024-02-25 LAB — BLOOD GAS, VENOUS
Acid-Base Excess: 6.3 mmol/L — ABNORMAL HIGH (ref 0.0–2.0)
Bicarbonate: 33.1 mmol/L — ABNORMAL HIGH (ref 20.0–28.0)
O2 Saturation: 81.9 %
Patient temperature: 37
pCO2, Ven: 56 mmHg (ref 44–60)
pH, Ven: 7.38 (ref 7.25–7.43)
pO2, Ven: 51 mmHg — ABNORMAL HIGH (ref 32–45)

## 2024-02-25 LAB — TROPONIN I (HIGH SENSITIVITY): Troponin I (High Sensitivity): 11 ng/L (ref <18)

## 2024-02-25 MED ORDER — KETOROLAC TROMETHAMINE 30 MG/ML IJ SOLN
15.0000 mg | Freq: Once | INTRAMUSCULAR | Status: AC
  Administered 2024-02-25: 15 mg via INTRAVENOUS
  Filled 2024-02-25: qty 1

## 2024-02-25 NOTE — Discharge Instructions (Addendum)
 Please follow-up with your doctor within the next 1 to 2 days for recheck/reevaluation.  Return to the emergency department for any symptoms concerning to yourself.

## 2024-02-25 NOTE — ED Notes (Signed)
 Pt resting in ED stretcher. Pt ABCs intact. RR even and unlabored. Pt in NAD. Bed in lowest locked position. Call bell in reach. Denies needs at this time.

## 2024-02-25 NOTE — ED Provider Notes (Signed)
.-----------------------------------------   7:10 AM on 02/25/2024 -----------------------------------------  Blood pressure (!) 183/79, pulse 75, temperature 98.3 F (36.8 C), temperature source Oral, resp. rate (!) 24, height 5' 7 (1.702 m), weight 122.5 kg, SpO2 94%.  Assuming care from Dr. Dorothyann.  In short, [name] is a 68 y.o. female with a chief complaint of Shortness of Breath .  Refer to the original H&P for additional details.  The current plan of care is to follow up VBG, ok to dc if not acute resp acidosis.  Independent review of ABG, patient is not acutely retaining CO2.  On reassessment she is satting 99% on room air, well-appearing.  Discussed with her about continuing her antibiotics for UTI, she had a dose yesterday.  Discussed with her about stopping her antibiotics for pneumonia since the CT chest did not show pneumonia.  Discussed with her about following up with a primary care doctor for her sleep study.  Otherwise she is well-appearing, no indication for inpatient admission at this time, she is safe for outpatient management.  Will discharge with strict precautions.  Shared decision making done with patient and she is agreeable with this plan.       Waymond Lorelle Cummins, MD 02/25/24 [phone]

## 2024-02-25 NOTE — ED Notes (Signed)
 X-ray at bedside

## 2024-02-25 NOTE — ED Provider Notes (Addendum)
 [facility] Provider Note    Event Date/Time   First MD Initiated Contact with Patient 02/25/24 [phone]     (approximate)  History   Chief Complaint: Shortness of Breath  HPI  [name] is a 68 y.o. female with a past medical history of bipolar, anxiety, gastric reflux, hypertension, hyperlipidemia, presents to the emergency department for shortness of breath.  According to the patient over the last month or possibly more she has been experiencing shortness of breath, worse with exertion.  Patient denies any chest pain.  No pleuritic pain.  Patient has noted some lower extremity edema over the last few weeks which she states is new.  States she has seen her doctor approximately 5 days ago and was diagnosed with possible pneumonia and placed on 2 antibiotics.  She was then seen in urgent care yesterday for dysuria and placed on an additional antibiotic for urinary tract infection.  Patient denies any fever.  Denies any cough.    Physical Exam   Triage Vital Signs: ED Triage Vitals  Encounter Vitals Group     BP 02/25/24 0407 (!) 183/79     Girls Systolic BP Percentile --      Girls Diastolic BP Percentile --      Boys Systolic BP Percentile --      Boys Diastolic BP Percentile --      Pulse Rate 02/25/24 0407 82     Resp 02/25/24 0407 18     Temp 02/25/24 0407 98.3 F (36.8 C)     Temp Source 02/25/24 0407 Oral     SpO2 02/25/24 0407 93 %     Weight 02/25/24 0401 270 lb (122.5 kg)     Height 02/25/24 0401 5' 7 (1.702 m)     Head Circumference --      Peak Flow --      Pain Score 02/25/24 0401 5     Pain Loc --      Pain Education --      Exclude from Growth Chart --     Most recent vital signs: Vitals:   02/25/24 0407  BP: (!) 183/79  Pulse: 82  Resp: 18  Temp: 98.3 F (36.8 C)  SpO2: 93%    General: Awake, no distress.  CV:  Good peripheral perfusion.  Regular rate and rhythm  Resp:  Normal effort.  Equal breath sounds bilaterally.   No wheeze rales or rhonchi. Abd:  No distention.  Soft, nontender.  No rebound or guarding. Other:  1+ lower extremities bilaterally.   ED Results / Procedures / Treatments   EKG  EKG viewed and interpreted by myself shows a normal sinus rhythm at 81 bpm with a borderline widened QRS, normal axis, normal intervals, no concerning ST changes  RADIOLOGY  I have reviewed interpret the chest x-ray images.  No obvious consolidation seen on my evaluation.  Possible vascular congestion. CT scan of the chest shows atelectasis but no infection no other acute finding. CT scan of the abdomen and pelvis shows no acute finding.  MEDICATIONS ORDERED IN ED: Medications - No data to display   IMPRESSION / MDM / ASSESSMENT AND PLAN / ED COURSE  I reviewed the triage vital signs and the nursing notes.  Patient's presentation is most consistent with acute presentation with potential threat to life or bodily function.  Patient presents to the emergency department for approximately 1 month of shortness of breath.  Patient states she was diagnosed with pneumonia  approximately 5 days ago on an x-ray.  I reviewed the x-ray report on care everywhere which states possible pneumonia versus mass in the hilum.  Will proceed with a CT scan to further evaluate this.  Will check labs including a troponin and a BNP given the patient's lower extremity edema and she states she has gained approximately 8 pounds over the last few days possibly indicating CHF.  Patient denies any history of CHF.  Patient also states over the last 2 days she has been experiencing pain in her right flank/right back at times.  At the patient was just diagnosed with urinary tract infection yesterday we will proceed with a CT renal scan to rule out stone or pyelonephritis.  We will continue to closely monitor while awaiting results.  Patient also states moderate headache, we will dose Toradol .  Patient's workup is overall reassuring.  CBC is  normal, chemistry shows mild LFT elevation.  Patient's troponin is normal, BNP is normal.  Urinalysis does show a few bacteria and 21-50 white blood cells, but the patient is currently just starting an antibiotic for this and a urine culture was sent yesterday.  Given the patient's slight LFT elevation as well as complaint of intermittent right flank pain we will obtain a right upper quadrant ultrasound as a precaution.  Overall patient's workup is reassuring.  I believe if the ultrasound does not show any significant abnormality patient could likely be discharged home with outpatient follow-up with her doctor.  Patient is agreeable to this plan as well.  Patient will desat down to around 90% or so while sleeping however when talking to me and awake she is satting 96% with a good waveform.  Patient's ultrasound is negative for acute abnormality.  Patient does continue to desat at times while sleeping however once awake her saturation increases into the mid 90s.  Will obtain a VBG as a precaution to rule out hypercarbia.  Patient states her doctor is arranging for her to have a sleep study for possible CPAP machine which I believe could be helpful.  However this appears to be an ongoing/chronic issue, if the VBG does not show any significant hypercarbia I believe the patient could be discharged home to follow-up with her PCP.  Patient care signed out to oncoming provider. FINAL CLINICAL IMPRESSION(S) / ED DIAGNOSES   Dyspnea Right flank pain Headache   Note:  This document was prepared using Dragon voice recognition software and may include unintentional dictation errors.   Dorothyann Drivers, MD 02/25/24 [id number]    Dorothyann Drivers, MD 02/25/24 9341    Dorothyann Drivers, MD 02/25/24 [phone]

## 2024-02-25 NOTE — ED Notes (Signed)
 Pt reporting to ED d/t SOB. Pt reports hx of recent dx for pneumonia and UTI and has been prescribed antibiotics for them accordingly. Pt aox4, ambulatory. Cardiac monitoring in place. Pt ABCs intact. RR even and unlabored. Pt in NAD. Bed in lowest locked position. Call bell in reach. Denies needs at this time.   Past Medical History:  Diagnosis Date   Anxiety    Arthritis    Barrett esophagus    Barrett's esophagus    Bipolar affective disorder, manic (HCC)    Bipolar disorder (HCC)    DDD (degenerative disc disease), lumbar    Depression    Diabetes mellitus without complication (HCC)    Exposure of vaginal mesh through vaginal wall (HCC)    Fatty liver    GERD (gastroesophageal reflux disease)    Glenohumeral arthritis    History of kidney stones    Hypercholesterolemia    Hypercholesterolemia    Hypertension    Hypothyroidism    Lichen sclerosus    Nephrolithiasis    PTSD (post-traumatic stress disorder)    Radiculopathy of lumbosacral region    RLS (restless legs syndrome)    Sciatica    Thyroid disease    Urge urinary incontinence

## 2024-02-25 NOTE — ED Triage Notes (Signed)
 Patient ambulatory to triage with steady gait, without difficulty or distress noted; pt reports CXR on  Friday indicated pneumonia and rx 2 antibiotics; also dx with UTI yesterday and received antibiotic inj; c/o persistent rt flank pain, HA and SHOB

## 2024-02-26 LAB — URINE CULTURE: Culture: 10000 — AB

## 2024-03-07 ENCOUNTER — Ambulatory Visit
Admission: RE | Admit: 2024-03-07 | Discharge: 2024-03-07 | Disposition: A | Discharge status: Home / Self Care | Source: Ambulatory Visit | Attending: Emergency Medicine | Admitting: Emergency Medicine

## 2024-03-07 VITALS — BP 172/96 | HR 74 | Temp 98.4°F | Resp 15 | Ht 67.0 in | Wt 270.1 lb

## 2024-03-07 DIAGNOSIS — N898 Other specified noninflammatory disorders of vagina: Secondary | ICD-10-CM

## 2024-03-07 MED ORDER — FLUCONAZOLE 150 MG PO TABS: 150.0000 mg | ORAL_TABLET | ORAL | 0 refills | Status: AC
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
## 2024-03-07 NOTE — Discharge Instructions (Addendum)
 Take 1 Diflucan  tablet now and repeat dosing every 3 days for total of 3 doses.  If your symptoms do not improve, or new symptoms develop, either return for reevaluation or follow-up with your primary care provider.

## 2024-03-07 NOTE — ED Triage Notes (Signed)
 Patient was previously on antibiotics.  Patient developed vaginal itching and discharge that started 2 days ago.
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
## 2024-03-26 ENCOUNTER — Emergency Department

## 2024-03-26 ENCOUNTER — Other Ambulatory Visit: Payer: Self-pay

## 2024-03-26 ENCOUNTER — Inpatient Hospital Stay
Admission: EM | Admit: 2024-03-26 | Discharge: 2024-03-29 | DRG: 291 | Disposition: A | Discharge status: Home / Self Care | Attending: Internal Medicine | Admitting: Internal Medicine

## 2024-03-26 DIAGNOSIS — J441 Chronic obstructive pulmonary disease with (acute) exacerbation: Secondary | ICD-10-CM | POA: Diagnosis present

## 2024-03-26 DIAGNOSIS — Z79899 Other long term (current) drug therapy: Secondary | ICD-10-CM

## 2024-03-26 DIAGNOSIS — Z888 Allergy status to other drugs, medicaments and biological substances status: Secondary | ICD-10-CM

## 2024-03-26 DIAGNOSIS — I110 Hypertensive heart disease with heart failure: Principal | ICD-10-CM | POA: Diagnosis present

## 2024-03-26 DIAGNOSIS — R519 Headache, unspecified: Secondary | ICD-10-CM | POA: Diagnosis present

## 2024-03-26 DIAGNOSIS — R0602 Shortness of breath: Secondary | ICD-10-CM | POA: Diagnosis not present

## 2024-03-26 DIAGNOSIS — E7800 Pure hypercholesterolemia, unspecified: Secondary | ICD-10-CM | POA: Diagnosis present

## 2024-03-26 DIAGNOSIS — J9611 Chronic respiratory failure with hypoxia: Secondary | ICD-10-CM

## 2024-03-26 DIAGNOSIS — E039 Hypothyroidism, unspecified: Secondary | ICD-10-CM | POA: Diagnosis present

## 2024-03-26 DIAGNOSIS — R079 Chest pain, unspecified: Secondary | ICD-10-CM

## 2024-03-26 DIAGNOSIS — I10 Essential (primary) hypertension: Secondary | ICD-10-CM | POA: Insufficient documentation

## 2024-03-26 DIAGNOSIS — Z7985 Long-term (current) use of injectable non-insulin antidiabetic drugs: Secondary | ICD-10-CM

## 2024-03-26 DIAGNOSIS — K219 Gastro-esophageal reflux disease without esophagitis: Secondary | ICD-10-CM | POA: Diagnosis present

## 2024-03-26 DIAGNOSIS — I509 Heart failure, unspecified: Secondary | ICD-10-CM

## 2024-03-26 DIAGNOSIS — Z91048 Other nonmedicinal substance allergy status: Secondary | ICD-10-CM

## 2024-03-26 DIAGNOSIS — K760 Fatty (change of) liver, not elsewhere classified: Secondary | ICD-10-CM | POA: Diagnosis present

## 2024-03-26 DIAGNOSIS — Z96652 Presence of left artificial knee joint: Secondary | ICD-10-CM | POA: Diagnosis present

## 2024-03-26 DIAGNOSIS — F17210 Nicotine dependence, cigarettes, uncomplicated: Secondary | ICD-10-CM | POA: Diagnosis present

## 2024-03-26 DIAGNOSIS — L900 Lichen sclerosus et atrophicus: Secondary | ICD-10-CM | POA: Diagnosis present

## 2024-03-26 DIAGNOSIS — Z885 Allergy status to narcotic agent status: Secondary | ICD-10-CM

## 2024-03-26 DIAGNOSIS — Z79890 Hormone replacement therapy (postmenopausal): Secondary | ICD-10-CM

## 2024-03-26 DIAGNOSIS — R0600 Dyspnea, unspecified: Secondary | ICD-10-CM

## 2024-03-26 DIAGNOSIS — N390 Urinary tract infection, site not specified: Secondary | ICD-10-CM | POA: Diagnosis present

## 2024-03-26 DIAGNOSIS — I5033 Acute on chronic diastolic (congestive) heart failure: Secondary | ICD-10-CM | POA: Diagnosis present

## 2024-03-26 DIAGNOSIS — Z7984 Long term (current) use of oral hypoglycemic drugs: Secondary | ICD-10-CM

## 2024-03-26 DIAGNOSIS — I272 Pulmonary hypertension, unspecified: Secondary | ICD-10-CM | POA: Diagnosis present

## 2024-03-26 DIAGNOSIS — Z7951 Long term (current) use of inhaled steroids: Secondary | ICD-10-CM

## 2024-03-26 DIAGNOSIS — Z79818 Long term (current) use of other agents affecting estrogen receptors and estrogen levels: Secondary | ICD-10-CM

## 2024-03-26 DIAGNOSIS — J9601 Acute respiratory failure with hypoxia: Secondary | ICD-10-CM | POA: Diagnosis present

## 2024-03-26 DIAGNOSIS — E66813 Obesity, class 3: Secondary | ICD-10-CM | POA: Diagnosis present

## 2024-03-26 DIAGNOSIS — E1165 Type 2 diabetes mellitus with hyperglycemia: Secondary | ICD-10-CM | POA: Diagnosis present

## 2024-03-26 DIAGNOSIS — E119 Type 2 diabetes mellitus without complications: Secondary | ICD-10-CM

## 2024-03-26 DIAGNOSIS — J449 Chronic obstructive pulmonary disease, unspecified: Secondary | ICD-10-CM | POA: Insufficient documentation

## 2024-03-26 DIAGNOSIS — Z803 Family history of malignant neoplasm of breast: Secondary | ICD-10-CM

## 2024-03-26 DIAGNOSIS — B961 Klebsiella pneumoniae [K. pneumoniae] as the cause of diseases classified elsewhere: Secondary | ICD-10-CM | POA: Diagnosis present

## 2024-03-26 DIAGNOSIS — Z6841 Body Mass Index (BMI) 40.0 and over, adult: Secondary | ICD-10-CM

## 2024-03-26 DIAGNOSIS — Z87440 Personal history of urinary (tract) infections: Secondary | ICD-10-CM

## 2024-03-26 LAB — BASIC METABOLIC PANEL WITH GFR
Anion gap: 11 (ref 5–15)
BUN: 36 mg/dL — ABNORMAL HIGH (ref 8–23)
CO2: 26 mmol/L (ref 22–32)
Calcium: 9.2 mg/dL (ref 8.9–10.3)
Chloride: 100 mmol/L (ref 98–111)
Creatinine, Ser: 0.93 mg/dL (ref 0.44–1.00)
GFR, Estimated: >60 mL/min (ref >60)
Glucose, Bld: 231 mg/dL — ABNORMAL HIGH (ref 70–99)
Potassium: 4.1 mmol/L (ref 3.5–5.1)
Sodium: 137 mmol/L (ref 135–145)

## 2024-03-26 LAB — TROPONIN I (HIGH SENSITIVITY)
Troponin I (High Sensitivity): 26 ng/L — ABNORMAL HIGH (ref <18)
Troponin I (High Sensitivity): 28 ng/L — ABNORMAL HIGH (ref <18)

## 2024-03-26 LAB — CBC
HCT: 34.8 % — ABNORMAL LOW (ref 36.0–46.0)
Hemoglobin: 10.4 g/dL — ABNORMAL LOW (ref 12.0–15.0)
MCH: 26.3 pg (ref 26.0–34.0)
MCHC: 29.9 g/dL — ABNORMAL LOW (ref 30.0–36.0)
MCV: 87.9 fL (ref 80.0–100.0)
Platelets: 314 K/uL (ref 150–400)
RBC: 3.96 MIL/uL (ref 3.87–5.11)
RDW: 16.3 % — ABNORMAL HIGH (ref 11.5–15.5)
WBC: 8.8 K/uL (ref 4.0–10.5)
nRBC: 0 % (ref 0.0–0.2)

## 2024-03-26 LAB — BRAIN NATRIURETIC PEPTIDE: B Natriuretic Peptide: 86.9 pg/mL (ref 0.0–100.0)

## 2024-03-26 MED ORDER — FUROSEMIDE 10 MG/ML IJ SOLN
40.0000 mg | Freq: Once | INTRAMUSCULAR | Status: AC
  Administered 2024-03-27: 40 mg via INTRAVENOUS
  Filled 2024-03-26: qty 4

## 2024-03-26 MED ORDER — IOHEXOL 350 MG/ML SOLN
100.0000 mL | Freq: Once | INTRAVENOUS | Status: AC | PRN
  Administered 2024-03-26: 100 mL via INTRAVENOUS

## 2024-03-26 NOTE — ED Provider Notes (Signed)
 [facility] Provider Note    Event Date/Time   First MD Initiated Contact with Patient 03/26/24 1941     (approximate)   History   Shortness of Breath and Chest Pain   HPI  [name] is a 68 y.o. female with history of diabetes, GERD, hypertension, hyperlipidemia, anxiety, here for chest pain and shortness of breath.  States that she has worsening dyspnea on exertion, also with some leg swelling.  States chest pain is anterior, nonradiating.  Intermittent.  She denies any cough or fever.  States that she went to see a cardiologist as well as her primary care doctor, states that they were suspecting it might be due to COPD and so started her on steroids and a breathing treatment without significant improvement.  She denies any recent travel or surgeries, no history of blood clots or history of malignancy.  No new cough.    On independent chart review, she was seen by cardiology on 13 August for her dyspnea on exertion.  Has prior history of tobacco use, COPD, diabetes, hypertension, had an echo done in July 2025 that showed normal EF, mild pulmonary hypertension.  Did have a CT PE study in 2024 that was negative.  Was on Lasix  for pedal edema in the past but no longer taking at this time.  She was seen by her PCP today before cardiology consult and started on COPD management with prednisone  and breathing treatments.  States that she can continue on her Lasix  for pedal edema.   Physical Exam   Triage Vital Signs: ED Triage Vitals  Encounter Vitals Group     BP 03/26/24 1428 (!) 148/76     Girls Systolic BP Percentile --      Girls Diastolic BP Percentile --      Boys Systolic BP Percentile --      Boys Diastolic BP Percentile --      Pulse Rate 03/26/24 1428 76     Resp 03/26/24 1428 18     Temp 03/26/24 1428 98.1 F (36.7 C)     Temp src --      SpO2 03/26/24 1428 100 %     Weight 03/26/24 1425 270 lb 1 oz (122.5 kg)     Height 03/26/24 1425 5' 7  (1.702 m)     Head Circumference --      Peak Flow --      Pain Score 03/26/24 1426 6     Pain Loc --      Pain Education --      Exclude from Growth Chart --     Most recent vital signs: Vitals:   03/26/24 1428 03/26/24 1854  BP: (!) 148/76 (!) 146/74  Pulse: 76 68  Resp: 18 18  Temp: 98.1 F (36.7 C) 98 F (36.7 C)  SpO2: 100% 95%     General: Awake, no distress.  CV:  Good peripheral perfusion.  Resp:  Normal effort.  Tachypneic to the mid 20s, no wheezing on exam Abd:  No distention.  Soft nontender Other:  Bilateral lower extremity edema that appears symmetrical.   ED Results / Procedures / Treatments   Labs (all labs ordered are listed, but only abnormal results are displayed) Labs Reviewed  BASIC METABOLIC PANEL WITH GFR - Abnormal; Notable for the following components:      Result Value   Glucose, Bld 231 (*)    BUN 36 (*)    All other components within  normal limits  CBC - Abnormal; Notable for the following components:   Hemoglobin 10.4 (*)    HCT 34.8 (*)    MCHC 29.9 (*)    RDW 16.3 (*)    All other components within normal limits  TROPONIN I (HIGH SENSITIVITY) - Abnormal; Notable for the following components:   Troponin I (High Sensitivity) 26 (*)    All other components within normal limits  TROPONIN I (HIGH SENSITIVITY) - Abnormal; Notable for the following components:   Troponin I (High Sensitivity) 28 (*)    All other components within normal limits  BRAIN NATRIURETIC PEPTIDE     EKG  EKG shows, sinus rhythm, rate of 78, normal QRS, normal QTc, incomplete left bundle branch block, T wave flattening in 3, V6, no obvious ischemic ST elevation, T wave flattening is new compared to prior   RADIOLOGY On my independent interpretation, chest x-ray shows vascular congestion   PROCEDURES:  Critical Care performed: No  Procedures   MEDICATIONS ORDERED IN ED: Medications  furosemide  (LASIX ) injection 40 mg (has no administration in time  range)     IMPRESSION / MDM / ASSESSMENT AND PLAN / ED COURSE  I reviewed the triage vital signs and the nursing notes.                              Differential diagnosis includes, but is not limited to, volume overload, CHF exacerbation, no wheezing on exam to suggest COPD, she denies any new cough or fever to suggest viral illness or pneumonia, did also consider PE but she has no unilateral calf pain or tenderness, given that her lungs are clear and she is quite tachypneic, will get a CT PE study, labs and chest x-ray were obtained out of triage, will add on a BNP.  Will also give her some IV Lasix .  She will need to be admitted for further management.  Patient's presentation is most consistent with acute presentation with potential threat to life or bodily function.  Independent interpretation of labs and imaging below.  Patient signed out pending CT imaging results, she will need to be admitted for CHF exacerbation.  The patient is on the cardiac monitor to evaluate for evidence of arrhythmia and/or significant heart rate changes.   Clinical Course as of 03/26/24 2328  Fri Mar 26, 2024  2028 DG Chest 2 View IMPRESSION: 1. Stable cardiomegaly and pulmonary vascular congestion. 2. Small amount of bilateral pleural fluid or thickening. There was corresponding thickening on the recent CT.   [TT]  2326 S/o from Dr. Waymond: 89F pmh chf S/p lasix  IV pending CTA pending  Anticipate admit for work of breathing [MM]    Clinical Course User Index [MM] Clarine Ozell LABOR, MD [TT] Waymond Lorelle Cummins, MD     FINAL CLINICAL IMPRESSION(S) / ED DIAGNOSES   Final diagnoses:  Shortness of breath  Acute on chronic congestive heart failure, unspecified heart failure type (HCC)  Chest pain, unspecified type     Rx / DC Orders   ED Discharge Orders     None        Note:  This document was prepared using Dragon voice recognition software and may include unintentional dictation  errors.    Waymond Lorelle Cummins, MD 03/26/24 [phone]

## 2024-03-26 NOTE — ED Triage Notes (Signed)
 Pt comes with sob and cp for awhile. Pt stats she was placed on inhalers the patient states mid sternal pain and radiates down her back.

## 2024-03-27 DIAGNOSIS — I110 Hypertensive heart disease with heart failure: Secondary | ICD-10-CM | POA: Diagnosis present

## 2024-03-27 DIAGNOSIS — I509 Heart failure, unspecified: Secondary | ICD-10-CM

## 2024-03-27 DIAGNOSIS — Z91048 Other nonmedicinal substance allergy status: Secondary | ICD-10-CM | POA: Diagnosis not present

## 2024-03-27 DIAGNOSIS — R0602 Shortness of breath: Secondary | ICD-10-CM | POA: Diagnosis present

## 2024-03-27 DIAGNOSIS — E66813 Obesity, class 3: Secondary | ICD-10-CM | POA: Diagnosis present

## 2024-03-27 DIAGNOSIS — J441 Chronic obstructive pulmonary disease with (acute) exacerbation: Secondary | ICD-10-CM | POA: Diagnosis present

## 2024-03-27 DIAGNOSIS — E7800 Pure hypercholesterolemia, unspecified: Secondary | ICD-10-CM | POA: Diagnosis present

## 2024-03-27 DIAGNOSIS — K760 Fatty (change of) liver, not elsewhere classified: Secondary | ICD-10-CM | POA: Diagnosis present

## 2024-03-27 DIAGNOSIS — Z87440 Personal history of urinary (tract) infections: Secondary | ICD-10-CM | POA: Diagnosis not present

## 2024-03-27 DIAGNOSIS — Z885 Allergy status to narcotic agent status: Secondary | ICD-10-CM | POA: Diagnosis not present

## 2024-03-27 DIAGNOSIS — N390 Urinary tract infection, site not specified: Secondary | ICD-10-CM | POA: Diagnosis present

## 2024-03-27 DIAGNOSIS — R0600 Dyspnea, unspecified: Secondary | ICD-10-CM

## 2024-03-27 DIAGNOSIS — I5033 Acute on chronic diastolic (congestive) heart failure: Secondary | ICD-10-CM | POA: Insufficient documentation

## 2024-03-27 DIAGNOSIS — E1165 Type 2 diabetes mellitus with hyperglycemia: Secondary | ICD-10-CM | POA: Diagnosis present

## 2024-03-27 DIAGNOSIS — B961 Klebsiella pneumoniae [K. pneumoniae] as the cause of diseases classified elsewhere: Secondary | ICD-10-CM | POA: Diagnosis present

## 2024-03-27 DIAGNOSIS — K219 Gastro-esophageal reflux disease without esophagitis: Secondary | ICD-10-CM | POA: Diagnosis present

## 2024-03-27 DIAGNOSIS — Z6841 Body Mass Index (BMI) 40.0 and over, adult: Secondary | ICD-10-CM | POA: Diagnosis not present

## 2024-03-27 DIAGNOSIS — I10 Essential (primary) hypertension: Secondary | ICD-10-CM | POA: Insufficient documentation

## 2024-03-27 DIAGNOSIS — Z79899 Other long term (current) drug therapy: Secondary | ICD-10-CM | POA: Diagnosis not present

## 2024-03-27 DIAGNOSIS — Z7984 Long term (current) use of oral hypoglycemic drugs: Secondary | ICD-10-CM | POA: Diagnosis not present

## 2024-03-27 DIAGNOSIS — F17210 Nicotine dependence, cigarettes, uncomplicated: Secondary | ICD-10-CM | POA: Diagnosis present

## 2024-03-27 DIAGNOSIS — J449 Chronic obstructive pulmonary disease, unspecified: Secondary | ICD-10-CM | POA: Insufficient documentation

## 2024-03-27 DIAGNOSIS — I272 Pulmonary hypertension, unspecified: Secondary | ICD-10-CM | POA: Diagnosis present

## 2024-03-27 DIAGNOSIS — J9601 Acute respiratory failure with hypoxia: Secondary | ICD-10-CM | POA: Diagnosis present

## 2024-03-27 DIAGNOSIS — E119 Type 2 diabetes mellitus without complications: Secondary | ICD-10-CM

## 2024-03-27 DIAGNOSIS — L900 Lichen sclerosus et atrophicus: Secondary | ICD-10-CM | POA: Diagnosis present

## 2024-03-27 DIAGNOSIS — R519 Headache, unspecified: Secondary | ICD-10-CM | POA: Diagnosis present

## 2024-03-27 DIAGNOSIS — E039 Hypothyroidism, unspecified: Secondary | ICD-10-CM | POA: Diagnosis present

## 2024-03-27 DIAGNOSIS — Z96652 Presence of left artificial knee joint: Secondary | ICD-10-CM | POA: Diagnosis present

## 2024-03-27 DIAGNOSIS — Z79890 Hormone replacement therapy (postmenopausal): Secondary | ICD-10-CM | POA: Diagnosis not present

## 2024-03-27 LAB — BASIC METABOLIC PANEL WITH GFR
Anion gap: 11 (ref 5–15)
BUN: 29 mg/dL — ABNORMAL HIGH (ref 8–23)
CO2: 31 mmol/L (ref 22–32)
Calcium: 8.3 mg/dL — ABNORMAL LOW (ref 8.9–10.3)
Chloride: 98 mmol/L (ref 98–111)
Creatinine, Ser: 0.77 mg/dL (ref 0.44–1.00)
GFR, Estimated: >60 mL/min (ref >60)
Glucose, Bld: 147 mg/dL — ABNORMAL HIGH (ref 70–99)
Potassium: 4 mmol/L (ref 3.5–5.1)
Sodium: 140 mmol/L (ref 135–145)

## 2024-03-27 LAB — POTASSIUM: Potassium: 4.1 mmol/L (ref 3.5–5.1)

## 2024-03-27 LAB — CBG MONITORING, ED
Glucose-Capillary: 229 mg/dL — ABNORMAL HIGH (ref 70–99)
Glucose-Capillary: 234 mg/dL — ABNORMAL HIGH (ref 70–99)

## 2024-03-27 LAB — MAGNESIUM: Magnesium: 2 mg/dL (ref 1.7–2.4)

## 2024-03-27 LAB — HIV ANTIBODY (ROUTINE TESTING W REFLEX): HIV Screen 4th Generation wRfx: NONREACTIVE

## 2024-03-27 LAB — GLUCOSE, CAPILLARY: Glucose-Capillary: 110 mg/dL — ABNORMAL HIGH (ref 70–99)

## 2024-03-27 MED ORDER — POLYETHYLENE GLYCOL 3350 17 G PO PACK
17.0000 g | PACK | Freq: Every day | ORAL | Status: DC | PRN
  Administered 2024-03-28: 17 g via ORAL
  Filled 2024-03-27: qty 1

## 2024-03-27 MED ORDER — BUDESON-GLYCOPYRROL-FORMOTEROL 160-9-4.8 MCG/ACT IN AERO
2.0000 | INHALATION_SPRAY | Freq: Two times a day (BID) | RESPIRATORY_TRACT | Status: DC
  Administered 2024-03-27 – 2024-03-29 (×5): 2 via RESPIRATORY_TRACT
  Filled 2024-03-27: qty 5.9

## 2024-03-27 MED ORDER — METHOCARBAMOL 500 MG PO TABS
500.0000 mg | ORAL_TABLET | Freq: Once | ORAL | Status: AC
  Administered 2024-03-27: 500 mg via ORAL
  Filled 2024-03-27: qty 1

## 2024-03-27 MED ORDER — INSULIN ASPART 100 UNIT/ML IJ SOLN
0.0000 [IU] | Freq: Three times a day (TID) | INTRAMUSCULAR | Status: DC
  Administered 2024-03-27 – 2024-03-28 (×3): 5 [IU] via SUBCUTANEOUS
  Administered 2024-03-28: 3 [IU] via SUBCUTANEOUS
  Administered 2024-03-28: 5 [IU] via SUBCUTANEOUS
  Administered 2024-03-29: 2 [IU] via SUBCUTANEOUS
  Filled 2024-03-27 (×6): qty 1

## 2024-03-27 MED ORDER — ENOXAPARIN SODIUM 60 MG/0.6ML IJ SOSY
60.0000 mg | PREFILLED_SYRINGE | INTRAMUSCULAR | Status: DC
  Administered 2024-03-27 – 2024-03-29 (×3): 60 mg via SUBCUTANEOUS
  Filled 2024-03-27 (×3): qty 0.6

## 2024-03-27 MED ORDER — ACETAMINOPHEN 650 MG RE SUPP: 650.0000 mg | Freq: Four times a day (QID) | RECTAL | Status: DC | PRN

## 2024-03-27 MED ORDER — ACETAMINOPHEN 325 MG PO TABS
650.0000 mg | ORAL_TABLET | Freq: Four times a day (QID) | ORAL | Status: DC | PRN
  Administered 2024-03-27: 650 mg via ORAL
  Filled 2024-03-27: qty 2

## 2024-03-27 MED ORDER — ENOXAPARIN SODIUM 40 MG/0.4ML IJ SOSY: 40.0000 mg | PREFILLED_SYRINGE | INTRAMUSCULAR | Status: DC

## 2024-03-27 MED ORDER — LOSARTAN POTASSIUM 50 MG PO TABS
50.0000 mg | ORAL_TABLET | Freq: Every day | ORAL | Status: DC
  Administered 2024-03-28: 50 mg via ORAL
  Filled 2024-03-27: qty 1

## 2024-03-27 MED ORDER — SEMAGLUTIDE 14 MG PO TABS: 14.0000 mg | ORAL_TABLET | Freq: Every day | ORAL | Status: DC

## 2024-03-27 MED ORDER — ONDANSETRON HCL 4 MG PO TABS: 4.0000 mg | ORAL_TABLET | Freq: Four times a day (QID) | ORAL | Status: DC | PRN

## 2024-03-27 MED ORDER — PANTOPRAZOLE SODIUM 40 MG PO TBEC
40.0000 mg | DELAYED_RELEASE_TABLET | Freq: Two times a day (BID) | ORAL | Status: DC
  Administered 2024-03-27 – 2024-03-29 (×5): 40 mg via ORAL
  Filled 2024-03-27 (×5): qty 1

## 2024-03-27 MED ORDER — METFORMIN HCL ER 500 MG PO TB24: 1000.0000 mg | ORAL_TABLET | Freq: Two times a day (BID) | ORAL | Status: DC

## 2024-03-27 MED ORDER — LEVOTHYROXINE SODIUM 50 MCG PO TABS
175.0000 ug | ORAL_TABLET | Freq: Every day | ORAL | Status: DC
  Administered 2024-03-28 – 2024-03-29 (×2): 175 ug via ORAL
  Filled 2024-03-27 (×2): qty 1

## 2024-03-27 MED ORDER — PREDNISONE 20 MG PO TABS
40.0000 mg | ORAL_TABLET | Freq: Every day | ORAL | Status: AC
  Administered 2024-03-28: 40 mg via ORAL
  Filled 2024-03-27 (×2): qty 2

## 2024-03-27 MED ORDER — FUROSEMIDE 10 MG/ML IJ SOLN
40.0000 mg | Freq: Every day | INTRAMUSCULAR | Status: DC
  Administered 2024-03-27: 40 mg via INTRAVENOUS
  Filled 2024-03-27: qty 4

## 2024-03-27 MED ORDER — IPRATROPIUM-ALBUTEROL 0.5-2.5 (3) MG/3ML IN SOLN
3.0000 mL | Freq: Four times a day (QID) | RESPIRATORY_TRACT | Status: DC | PRN
  Administered 2024-03-28 – 2024-03-29 (×2): 3 mL via RESPIRATORY_TRACT
  Filled 2024-03-27 (×2): qty 3

## 2024-03-27 MED ORDER — ALBUTEROL SULFATE (2.5 MG/3ML) 0.083% IN NEBU: 3.0000 mL | INHALATION_SOLUTION | Freq: Four times a day (QID) | RESPIRATORY_TRACT | Status: DC | PRN

## 2024-03-27 MED ORDER — PREDNISONE 10 MG PO TABS
50.0000 mg | ORAL_TABLET | Freq: Every day | ORAL | Status: DC
  Administered 2024-03-27: 50 mg via ORAL
  Filled 2024-03-27: qty 1

## 2024-03-27 MED ORDER — ONDANSETRON HCL 4 MG/2ML IJ SOLN: 4.0000 mg | Freq: Four times a day (QID) | INTRAMUSCULAR | Status: DC | PRN

## 2024-03-27 MED ORDER — INSULIN ASPART 100 UNIT/ML IJ SOLN: 0.0000 [IU] | Freq: Every day | INTRAMUSCULAR | Status: DC

## 2024-03-27 NOTE — Consult Note (Signed)
 [facility] CLINIC CARDIOLOGY CONSULT NOTE       Patient ID: [name] MRN: [id number] DOB/AGE: [birth year] 68 y.o.  Admit date: 03/26/2024 Referring Physician Dr. Delayne Solian Primary Physician Perri, Mychal Burnard, PA-C  Primary Cardiologist Dr. Wilburn  Reason for Consultation ?AoCHF  HPI: [name] is a 68 y.o. female  with a past medical history of tobacco use, COPD, diabetes, hypertension who presented to the ED on 03/26/2024 for dizziness and headache. Also with exertional dyspnea recently BNP normal. Cardiology was consulted for further evaluation.   Patient reports that yesterday morning she woke up with a headache and was feeling dizzy was fatigued.  States that she got up and went into her kitchen and sat down and fell back asleep for a few hours.  Dizziness and headaches have been an ongoing issue for her recently so she decided to come to the ED for further evaluation.  Also endorses exertional dyspnea recently that has been stable.  Workup in the ED notable for creatinine 0.93, potassium 4.1, hemoglobin 10.4, WBC 8.8. Troponins 26 > 28, BNP 86. EKG in the ED NSR rate 78 bpm, nonischemic. CTA chest with trace pleural effusions, no PE. Started on IV lasix  in the ED as well as steroids and nebs.   At the time of my evaluation this morning patient was resting comfortably in hospital bed.  We discussed her recent symptoms in further detail.  She was recently seen outpatient by cardiology who did not feel strongly that her dyspnea is related to heart failure as her recent echo was essentially unremarkable with normal heart function.  It did note some mild pulmonary hypertension and she is scheduled to see pulmonology in a few days.  She was recently diagnosed with COPD.  Suspect there is also some component of obstructive sleep apnea/OHS as well.  Review of systems complete and found to be negative unless listed above    Past Medical History:  Diagnosis Date   Anxiety     Arthritis    Barrett esophagus    Barrett's esophagus    Bipolar affective disorder, manic (HCC)    Bipolar disorder (HCC)    DDD (degenerative disc disease), lumbar    Depression    Diabetes mellitus without complication (HCC)    Exposure of vaginal mesh through vaginal wall (HCC)    Fatty liver    GERD (gastroesophageal reflux disease)    Glenohumeral arthritis    History of kidney stones    Hypercholesterolemia    Hypercholesterolemia    Hypertension    Hypothyroidism    Lichen sclerosus    Nephrolithiasis    PTSD (post-traumatic stress disorder)    Radiculopathy of lumbosacral region    RLS (restless legs syndrome)    Sciatica    Thyroid disease    Urge urinary incontinence     Past Surgical History:  Procedure Laterality Date   BIOPSY  07/24/2023   Procedure: BIOPSY;  Surgeon: Onita Elspeth Sharper, DO;  Location: [facility] ENDOSCOPY;  Service: Gastroenterology;;   BLADDER SUSPENSION  2009   BREAST EXCISIONAL BIOPSY Left 04/11/2008   neg   BREAST SURGERY     CESAREAN SECTION     COLONOSCOPY WITH PROPOFOL  N/A 12/23/2017   Procedure: COLONOSCOPY WITH PROPOFOL ;  Surgeon: Gaylyn Gladis PENNER, MD;  Location: [facility] ENDOSCOPY;  Service: Endoscopy;  Laterality: N/A;   COLONOSCOPY WITH PROPOFOL  N/A 07/24/2023   Procedure: COLONOSCOPY WITH PROPOFOL ;  Surgeon: Onita Elspeth Sharper, DO;  Location: [facility] ENDOSCOPY;  Service:  Gastroenterology;  Laterality: N/A;   CYSTOCELE REPAIR     CYSTOURETHROSCOPY     ESOPHAGOGASTRODUODENOSCOPY (EGD) WITH PROPOFOL  N/A 12/23/2017   Procedure: ESOPHAGOGASTRODUODENOSCOPY (EGD) WITH PROPOFOL ;  Surgeon: Gaylyn Gladis PENNER, MD;  Location: [facility] ENDOSCOPY;  Service: Endoscopy;  Laterality: N/A;   ESOPHAGOGASTRODUODENOSCOPY (EGD) WITH PROPOFOL  N/A 07/24/2023   Procedure: ESOPHAGOGASTRODUODENOSCOPY (EGD) WITH PROPOFOL ;  Surgeon: Onita Elspeth Sharper, DO;  Location: [facility] ENDOSCOPY;  Service: Gastroenterology;  Laterality: N/A;   HERNIA REPAIR     JOINT  REPLACEMENT     LAPAROSCOPIC SALPINGOOPHERECTOMY Left 1976   MENISCECTOMY Bilateral 1980, 2000   OOPHORECTOMY Left    PERCUTANEOUS NEPHROSTOLITHOTOMY     POLYPECTOMY  07/24/2023   Procedure: POLYPECTOMY;  Surgeon: Onita Elspeth Sharper, DO;  Location: [facility] ENDOSCOPY;  Service: Gastroenterology;;   REPLACEMENT TOTAL KNEE Left 2010   TUBAL LIGATION      (Not in a hospital admission)  Social History   Socioeconomic History   Marital status: Widowed    Spouse name: Not on file   Number of children: Not on file   Years of education: Not on file   Highest education level: Not on file  Occupational History   Not on file  Tobacco Use   Smoking status: Former    Current packs/day: 0.50    Average packs/day: 0.5 packs/day for 10.0 years (5.0 ttl pk-yrs)    Types: Cigarettes   Smokeless tobacco: Never   Tobacco comments:    few cigarettes per week  Vaping Use   Vaping status: Never Used  Substance and Sexual Activity   Alcohol use: Not Currently    Comment: rarely   Drug use: Not Currently    Types: Marijuana    Comment: once in a while   Sexual activity: Not on file  Other Topics Concern   Not on file  Social History Narrative   Not on file   Social Drivers of Health   Financial Resource Strain: Low Risk  (03/24/2024)   Received from [facility]   Overall Financial Resource Strain (CARDIA)    Difficulty of Paying Living Expenses: Not very hard  Food Insecurity: No Food Insecurity (03/24/2024)   Received [facility]   Hunger Vital Sign    Within the past 12 months, you worried that your food would run out before you got the money to buy more.: Never true    Within the past 12 months, the food you bought just didn't last and you didn't have money to get more.: Never true  Transportation Needs: No Transportation Needs (03/24/2024)   Received from [facility] - Transportation    In the past 12 months, has  lack of transportation kept you from medical appointments or from getting medications?: No    Lack of Transportation (Non-Medical): No  Physical Activity: Not on file  Stress: Not on file  Social Connections: Not on file  Intimate Partner Violence: Not on file    Family History  Problem Relation Age of Onset   Breast cancer Sister 33       half sister on fathers side   Breast cancer Maternal Grandmother    Breast cancer Niece      Vitals:   03/27/24 0500 03/27/24 0530 03/27/24 0630 03/27/24 0801  BP: (!) 147/58   (!) 140/77  Pulse: 77 90 81 79  Resp: (!) 22 17 18  (!) 24  Temp:    (!) 97.5 F (36.4 C)  TempSrc:    Axillary  SpO2: 94% 93% 93% 95%  Weight:      Height:        PHYSICAL EXAM General: Well appearing female, well nourished, in no acute distress. HEENT: Normocephalic and atraumatic. Neck: No JVD.  Lungs: Normal respiratory effort on room air. Clear bilaterally to auscultation. No wheezes, crackles, rhonchi.  Heart: HRRR. Normal S1 and S2 without gallops or murmurs.  Abdomen: Non-distended appearing.  Msk: Normal strength and tone for age. Extremities: Warm and well perfused. No clubbing, cyanosis. No edema.  Neuro: Alert and oriented X 3. Psych: Answers questions appropriately.   Labs: Basic Metabolic Panel: Recent Labs    03/26/24 1427 03/27/24 0924  NA 137 140  K 4.1 4.0  CL 100 98  CO2 26 31  GLUCOSE 231* 147*  BUN 36* 29*  CREATININE 0.93 0.77  CALCIUM 9.2 8.3*   Liver Function Tests: No results for input(s): AST, ALT, ALKPHOS, BILITOT, PROT, ALBUMIN in the last 72 hours. No results for input(s): LIPASE, AMYLASE in the last 72 hours. CBC: Recent Labs    03/26/24 1427  WBC 8.8  HGB 10.4*  HCT 34.8*  MCV 87.9  PLT 314   Cardiac Enzymes: Recent Labs    03/26/24 1427 03/26/24 1724  TROPONINIHS 26* 28*   BNP: Recent Labs    03/26/24 1427  BNP 86.9   D-Dimer: No results for input(s): DDIMER in the last 72  hours. Hemoglobin A1C: No results for input(s): HGBA1C in the last 72 hours. Fasting Lipid Panel: No results for input(s): CHOL, HDL, LDLCALC, TRIG, CHOLHDL, LDLDIRECT in the last 72 hours. Thyroid Function Tests: No results for input(s): TSH, T4TOTAL, T3FREE, THYROIDAB in the last 72 hours.  Invalid input(s): FREET3 Anemia Panel: No results for input(s): VITAMINB12, FOLATE, FERRITIN, TIBC, IRON, RETICCTPCT in the last 72 hours.   Radiology: CT Angio Chest PE W/Cm &/Or Wo Cm Result Date: 03/27/2024 CLINICAL DATA:  Pulmonary embolism (PE) suspected, high prob. Shortness of breath, chest pain EXAM: CT ANGIOGRAPHY CHEST WITH CONTRAST TECHNIQUE: Multidetector CT imaging of the chest was performed using the standard protocol during bolus administration of intravenous contrast. Multiplanar CT image reconstructions and MIPs were obtained to evaluate the vascular anatomy. RADIATION DOSE REDUCTION: This exam was performed according to the departmental dose-optimization program which includes automated exposure control, adjustment of the mA and/or kV according to patient size and/or use of iterative reconstruction technique. CONTRAST:  OMNIPAQUE  IOHEXOL  350 MG/ML SOLN COMPARISON:  02/25/2024 FINDINGS: Cardiovascular: Cardiomegaly. Aorta normal caliber with scattered calcifications. No filling defects in the pulmonary arteries to suggest pulmonary emboli. Mediastinum/Nodes: No mediastinal, hilar, or axillary adenopathy. Trachea and esophagus are unremarkable. Thyroid unremarkable. Lungs/Pleura: Predominantly linear opacities in the lung bases most compatible with atelectasis. Trace bilateral pleural effusions. Right mid lung atelectasis anteriorly. Upper Abdomen: No acute findings Musculoskeletal: Chest wall soft tissues are unremarkable. No acute bony abnormality. Review of the MIP images confirms the above findings. IMPRESSION: No evidence of pulmonary embolus.  Cardiomegaly. Trace bilateral pleural effusions with right mid lung and bibasilar atelectasis. Aortic Atherosclerosis (ICD10-I70.0). Electronically Signed   By: Franky Crease M.D.   On: 03/27/2024 00:46   DG Chest 2 View Result Date: 03/26/2024 CLINICAL DATA:  Chest pain and shortness of breath. The pain is midsternal and radiates down her back. EXAM: CHEST - 2 VIEW COMPARISON:  02/25/2024.  Chest CT dated 02/25/2024. FINDINGS: Stable enlarged cardiac silhouette and prominent pulmonary vasculature. Clear lungs. Small amount of bilateral pleural  fluid or thickening posteriorly. There was a commensurate degree of pleural thickening on the recent CT. Thoracic spine degenerative changes including changes of DISH. IMPRESSION: 1. Stable cardiomegaly and pulmonary vascular congestion. 2. Small amount of bilateral pleural fluid or thickening. There was corresponding thickening on the recent CT. Electronically Signed   By: Elspeth Bathe M.D.   On: 03/26/2024 15:53    ECHO 02/2024: NORMAL LEFT VENTRICULAR SYSTOLIC FUNCTION WITH NO LVH  ESTIMATED EF: >55%, CALC EF(2D): 59%  INDETERMINATE DIASTOLIC FUNCTION  NORMAL RIGHT VENTRICULAR SYSTOLIC FUNCTION  VALVULAR REGURGITATION: No AR, MILD MR, TRIVIAL PR, MILD TR  ESTIMATED RVSP: 45 mmHg (ABNORMAL)  NO VALVULAR STENOSIS   TELEMETRY reviewed by me 03/27/2024: NSR rate 70s  EKG reviewed by me: NSR rate 78 bpm  Data reviewed by me 03/27/2024: last 24h vitals tele labs imaging I/O ED provider note, admission H&P  Principal Problem:   CHF (congestive heart failure) (HCC) Active Problems:   Diabetes mellitus without complication (HCC)   Obesity, Class III, BMI 40-49.9 (morbid obesity)   Chronic obstructive pulmonary disease (COPD) (HCC)   Pulmonary hypertension (HCC)   HTN (hypertension)   Acute dyspnea    ASSESSMENT AND PLAN:  [name] is a 68 y.o. female  with a past medical history of tobacco use, COPD, diabetes, hypertension who presented to the ED  on 03/26/2024 for dizziness and headache. Also with exertional dyspnea recently BNP normal. Cardiology was consulted for further evaluation.   # Exertional dyspnea # COPD # Mild pulmonary hypertension # Headaches, dizziness # Hypertension Patient with recent symptoms of headaches, dizziness, exertional dyspnea presented to the ED for further evaluation.  BNP normal limits.  CTA chest demonstrated trace pleural effusions bilaterally with no evidence of PE.  She was started on IV Lasix  in the ED as well as steroids and nebulizers. - Will continue IV Lasix  40 mg once daily today, likely plan to discontinue tomorrow as symptoms are likely less related to heart failure and do not want to overdiuresed patient.  Will continue to monitor renal function closely. - Further management of COPD as per primary team.  Patient should follow-up as scheduled outpatient with pulmonology. - Would benefit from evaluation for obstructive sleep apnea. - Continue losartan  50 mg daily.   This patient's plan of care was discussed and created with Dr. Florencio and he is in agreement.  Signed: Danita Bloch, PA-C  03/27/2024, 10:32 AM [facility] Cardiology

## 2024-03-27 NOTE — Hospital Course (Signed)
 SABRA

## 2024-03-27 NOTE — ED Provider Notes (Signed)
  Physical Exam  BP (!) 153/109 (BP Location: Right Arm)   Pulse 68   Temp 97.6 F (36.4 C) (Oral)   Resp 19   Ht 5' 7 (1.702 m)   Wt 122.5 kg   SpO2 95%   BMI 42.30 kg/m   Physical Exam  Procedures  Procedures  ED Course / MDM   Clinical Course as of 03/27/24 0125  Fri Mar 26, 2024  2028 DG Chest 2 View IMPRESSION: 1. Stable cardiomegaly and pulmonary vascular congestion. 2. Small amount of bilateral pleural fluid or thickening. There was corresponding thickening on the recent CT.   [TT]  2326 S/o from Dr. Waymond: 103F pmh chf S/p lasix  IV pending CTA pending  Anticipate admit for work of breathing [MM]  Sat Mar 27, 2024  0125 CTPE: IMPRESSION: No evidence of pulmonary embolus.  Cardiomegaly.  Trace bilateral pleural effusions with right mid lung and bibasilar atelectasis.    Presented to hospitalist for admission [MM]    Clinical Course User Index [MM] Clarine Ozell LABOR, MD [TT] Waymond Lorelle Cummins, MD   Medical Decision Making Amount and/or Complexity of Data Reviewed Labs: ordered. Radiology: ordered. Decision-making details documented in ED Course.  Risk Prescription drug management. Decision regarding hospitalization.          Clarine Ozell LABOR, MD 03/27/24 [phone]

## 2024-03-27 NOTE — Assessment & Plan Note (Signed)
 Complicating factor to overall prognosis and care

## 2024-03-27 NOTE — Assessment & Plan Note (Signed)
Continue carvedilol and losartan 

## 2024-03-27 NOTE — Assessment & Plan Note (Signed)
 Consider pulmonology consult

## 2024-03-27 NOTE — Progress Notes (Signed)
 PHARMACIST - PHYSICIAN COMMUNICATION  CONCERNING:  Enoxaparin  (Lovenox ) for DVT Prophylaxis    RECOMMENDATION: Patient was prescribed enoxaprin 40mg  q24 hours for VTE prophylaxis.   Filed Weights   03/26/24 1425  Weight: 122.5 kg (270 lb 1 oz)    Body mass index is 42.3 kg/m.  Estimated Creatinine Clearance: 81.9 mL/min (by C-G formula based on SCr of 0.93 mg/dL).   Based on [facility] policy patient is candidate for enoxaparin  0.5mg /kg TBW SQ every 24 hours based on BMI being >30.   DESCRIPTION: Pharmacy has adjusted enoxaparin  dose per [facility] policy.  Patient is now receiving enoxaparin  60 mg every 24 hours   Estill CHRISTELLA Lutes, PharmD, BCPS Clinical Pharmacist 03/27/2024 8:22 AM

## 2024-03-27 NOTE — H&P (Addendum)
 History and Physical    Patient: [name] FMW:[id number] DOB: [birth year] DOA: 03/26/2024 DOS: the patient was seen and examined on 03/27/2024 PCP: Perri Constance Sor, PA-C  Patient coming from: Home  Chief Complaint:  Chief Complaint  Patient presents with   Shortness of Breath   Chest Pain    HPI: [name] is a 68 y.o. female with medical history significant for Morbid obesity, COPD, DM, HTN, , who was evaluated by cardiology for exertional dyspnea just a couple days prior on 03/24/2024 who is being admitted with persistent dyspnea possibly multifactorial.  She reports exertional shortness of breath, exertional chest discomfort with progressively worsening lower extremity edema.  She is currently on Lasix  but stated she stopped taking it due to leg cramps, restarting on 8/13 after seeing the cardiologist. .  She denies cough, fever or chills and denies lower extremity pain.  Of note, she had a recent echo with normal EF, mild MR/TR and mild pulmonary hypertension. In the ED, SBP in the 140s to 150s with otherwise normal vitals.Sats in the high 90s on room air Troponin 26-->28 and BNP 86 Labs otherwise notable for glucose 231 and hemoglobin 10 which is her baseline EKG showing sinus at 76 with incomplete LBBB CTA chest negative and showing trace bilateral10 and hemoglobin pleural effusions. Patient treated with IV Lasix  40 mg Admission requested     Review of Systems: As mentioned in the history of present illness. All other systems reviewed and are negative.  Past Medical History:  Diagnosis Date   Anxiety    Arthritis    Barrett esophagus    Barrett's esophagus    Bipolar affective disorder, manic (HCC)    Bipolar disorder (HCC)    DDD (degenerative disc disease), lumbar    Depression    Diabetes mellitus without complication (HCC)    Exposure of vaginal mesh through vaginal wall (HCC)    Fatty liver    GERD (gastroesophageal reflux disease)    Glenohumeral  arthritis    History of kidney stones    Hypercholesterolemia    Hypercholesterolemia    Hypertension    Hypothyroidism    Lichen sclerosus    Nephrolithiasis    PTSD (post-traumatic stress disorder)    Radiculopathy of lumbosacral region    RLS (restless legs syndrome)    Sciatica    Thyroid disease    Urge urinary incontinence    Past Surgical History:  Procedure Laterality Date   BIOPSY  07/24/2023   Procedure: BIOPSY;  Surgeon: Onita Elspeth Sharper, DO;  Location: [facility] ENDOSCOPY;  Service: Gastroenterology;;   BLADDER SUSPENSION  2009   BREAST EXCISIONAL BIOPSY Left 04/11/2008   neg   BREAST SURGERY     CESAREAN SECTION     COLONOSCOPY WITH PROPOFOL  N/A 12/23/2017   Procedure: COLONOSCOPY WITH PROPOFOL ;  Surgeon: Gaylyn Gladis PENNER, MD;  Location: [facility] ENDOSCOPY;  Service: Endoscopy;  Laterality: N/A;   COLONOSCOPY WITH PROPOFOL  N/A 07/24/2023   Procedure: COLONOSCOPY WITH PROPOFOL ;  Surgeon: Onita Elspeth Sharper, DO;  Location: [facility] ENDOSCOPY;  Service: Gastroenterology;  Laterality: N/A;   CYSTOCELE REPAIR     CYSTOURETHROSCOPY     ESOPHAGOGASTRODUODENOSCOPY (EGD) WITH PROPOFOL  N/A 12/23/2017   Procedure: ESOPHAGOGASTRODUODENOSCOPY (EGD) WITH PROPOFOL ;  Surgeon: Gaylyn Gladis PENNER, MD;  Location: [facility] ENDOSCOPY;  Service: Endoscopy;  Laterality: N/A;   ESOPHAGOGASTRODUODENOSCOPY (EGD) WITH PROPOFOL  N/A 07/24/2023   Procedure: ESOPHAGOGASTRODUODENOSCOPY (EGD) WITH PROPOFOL ;  Surgeon: Onita Elspeth Sharper, DO;  Location: [facility] ENDOSCOPY;  Service: Gastroenterology;  Laterality: N/A;   HERNIA REPAIR     JOINT REPLACEMENT     LAPAROSCOPIC SALPINGOOPHERECTOMY Left 1976   MENISCECTOMY Bilateral 1980, 2000   OOPHORECTOMY Left    PERCUTANEOUS NEPHROSTOLITHOTOMY     POLYPECTOMY  07/24/2023   Procedure: POLYPECTOMY;  Surgeon: Onita Elspeth Sharper, DO;  Location: [facility] ENDOSCOPY;  Service: Gastroenterology;;   REPLACEMENT TOTAL KNEE Left 2010   TUBAL LIGATION     Social  History:  reports that she has quit smoking. Her smoking use included cigarettes. She has a 5 pack-year smoking history. She has never used smokeless tobacco. She reports that she does not currently use alcohol. She reports that she does not currently use drugs after having used the following drugs: Marijuana.  Allergies  Allergen Reactions   Chloraseptic Max Sore Throat  [Phenol-Glycerin] Anaphylaxis   Tramadol Other (See Comments)    Other Reaction: FELT DRUNK, NAUSEA & VOMITING   Benzocaine -Menthol Other (See Comments)    Throat swelling.   Haloperidol Decanoate Other (See Comments)    Other Reaction: LOCK JAW   Lisinopril     Family History  Problem Relation Age of Onset   Breast cancer Sister 10       half sister on fathers side   Breast cancer Maternal Grandmother    Breast cancer Niece     Prior to Admission medications   Medication Sig Start Date End Date Taking? Authorizing Provider  acetaminophen  (TYLENOL ) 500 MG tablet Take 500 mg by mouth every 6 (six) hours as needed.    [provider]  albuterol  (PROVENTIL  HFA;VENTOLIN  HFA) 108 (90 Base) MCG/ACT inhaler Inhale 2 puffs into the lungs every 6 (six) hours as needed for wheezing or shortness of breath. 12/01/17   Jacolyn Pae, MD  baclofen  (LIORESAL ) 10 MG tablet Take 1 tablet (10 mg total) by mouth at bedtime as needed. 08/21/23   Arvis Huxley B, PA-C  carvedilol (COREG) 6.25 MG tablet Take 1 tablet by mouth 2 (two) times daily. Patient not taking: Reported on 07/24/2023 09/25/17   [provider]  Elastic Bandages & Supports (T.E.D. KNEE LENGTH/XL-LONG) MISC 2 each by Does not apply route daily as needed. 06/28/23   Sung, Jade J, MD  esomeprazole (NEXIUM) 40 MG capsule Take 1 capsule by mouth daily as needed (GERD or heartburn).  Patient not taking: Reported on 07/24/2023 09/27/17   [provider]  fexofenadine  (ALLEGRA ) 180 MG tablet Take 1 tablet (180 mg total) by mouth daily. 02/17/24    Bernardino Ditch, NP  levothyroxine  (SYNTHROID ) 200 MCG tablet Take by mouth. 12/18/19   [provider]  levothyroxine  (SYNTHROID ) 25 MCG tablet Take by mouth. 12/18/19 12/17/20  [provider]  levothyroxine  (SYNTHROID , LEVOTHROID) 175 MCG tablet Take 175 mcg by mouth daily. 09/01/15 08/21/23  [provider]  losartan  (COZAAR ) 50 MG tablet Take 50 mg by mouth daily.    [provider]  magnesium oxide (MAG-OX) 400 (240 Mg) MG tablet Take 400 mg by mouth daily.    [provider]  metFORMIN  (GLUCOPHAGE -XR) 500 MG 24 hr tablet Take 1,000 mg by mouth every evening.    [provider]  oxybutynin (DITROPAN) 5 MG tablet Take 5 mg by mouth 3 (three) times daily. Patient not taking: Reported on 07/24/2023    [provider]  pantoprazole  (PROTONIX ) 40 MG tablet Take 1 tablet by mouth 2 (two) times daily. 08/14/23   [provider]  phenazopyridine  (PYRIDIUM ) 200 MG tablet Take 1 tablet (200 mg  total) by mouth 3 (three) times daily as needed for pain. 02/24/24   Van Knee, MD  Semaglutide  7 MG TABS Take 1 tablet by mouth daily.    [provider]  tamsulosin (FLOMAX) 0.4 MG CAPS capsule Take 0.4 mg by mouth.    [provider]  Vibegron 75 MG TABS Take 75 mg by mouth daily.    [provider]    Physical Exam: Vitals:   03/26/24 1854 03/27/24 0027 03/27/24 0029 03/27/24 0029  BP: (!) 146/74 (!) 153/109 (!) 153/109   Pulse: 68  68   Resp: 18 17 19    Temp: 98 F (36.7 C)   97.6 F (36.4 C)  TempSrc:    Oral  SpO2: 95%  95%   Weight:      Height:       Physical Exam Vitals and nursing note reviewed.  Constitutional:      General: She is not in acute distress. HENT:     Head: Normocephalic and atraumatic.  Cardiovascular:     Rate and Rhythm: Normal rate and regular rhythm.     Heart sounds: Normal heart sounds.  Pulmonary:     Effort: Pulmonary effort is normal.     Breath sounds: Normal  breath sounds.     Comments: Breath sounds diminished at bases. Abdominal:     Palpations: Abdomen is soft.     Tenderness: There is no abdominal tenderness.  Musculoskeletal:     Right lower leg: 3+ Edema present.     Left lower leg: 3+ Edema present.  Neurological:     Mental Status: Mental status is at baseline.     Labs on Admission: I have personally reviewed following labs and imaging studies  CBC: Recent Labs  Lab 03/26/24 1427  WBC 8.8  HGB 10.4*  HCT 34.8*  MCV 87.9  PLT 314   Basic Metabolic Panel: Recent Labs  Lab 03/26/24 1427  NA 137  K 4.1  CL 100  CO2 26  GLUCOSE 231*  BUN 36*  CREATININE 0.93  CALCIUM 9.2   GFR: Estimated Creatinine Clearance: 81.9 mL/min (by C-G formula based on SCr of 0.93 mg/dL). Liver Function Tests: No results for input(s): AST, ALT, ALKPHOS, BILITOT, PROT, ALBUMIN in the last 168 hours. No results for input(s): LIPASE, AMYLASE in the last 168 hours. No results for input(s): AMMONIA in the last 168 hours. Coagulation Profile: No results for input(s): INR, PROTIME in the last 168 hours. Cardiac Enzymes: No results for input(s): CKTOTAL, CKMB, CKMBINDEX, TROPONINI in the last 168 hours. BNP (last 3 results) No results for input(s): PROBNP in the last 8760 hours. HbA1C: No results for input(s): HGBA1C in the last 72 hours. CBG: No results for input(s): GLUCAP in the last 168 hours. Lipid Profile: No results for input(s): CHOL, HDL, LDLCALC, TRIG, CHOLHDL, LDLDIRECT in the last 72 hours. Thyroid Function Tests: No results for input(s): TSH, T4TOTAL, FREET4, T3FREE, THYROIDAB in the last 72 hours. Anemia Panel: No results for input(s): VITAMINB12, FOLATE, FERRITIN, TIBC, IRON, RETICCTPCT in the last 72 hours. Urine analysis:    Component Value Date/Time   COLORURINE YELLOW (A) 02/25/2024 0539   APPEARANCEUR HAZY (A) 02/25/2024 0539   APPEARANCEUR  Clear 03/21/2013 0638   LABSPEC 1.015 02/25/2024 0539   LABSPEC 1.017 03/21/2013 0638   PHURINE 5.0 02/25/2024 0539   GLUCOSEU NEGATIVE 02/25/2024 0539   GLUCOSEU Negative 03/21/2013 0638   HGBUR NEGATIVE 02/25/2024 0539   BILIRUBINUR NEGATIVE 02/25/2024 0539  BILIRUBINUR Negative 03/21/2013 0638   KETONESUR NEGATIVE 02/25/2024 0539   PROTEINUR NEGATIVE 02/25/2024 0539   NITRITE NEGATIVE 02/25/2024 0539   LEUKOCYTESUR SMALL (A) 02/25/2024 0539   LEUKOCYTESUR Trace 03/21/2013 9361    Radiological Exams on Admission: CT Angio Chest PE W/Cm &/Or Wo Cm Result Date: 03/27/2024 CLINICAL DATA:  Pulmonary embolism (PE) suspected, high prob. Shortness of breath, chest pain EXAM: CT ANGIOGRAPHY CHEST WITH CONTRAST TECHNIQUE: Multidetector CT imaging of the chest was performed using the standard protocol during bolus administration of intravenous contrast. Multiplanar CT image reconstructions and MIPs were obtained to evaluate the vascular anatomy. RADIATION DOSE REDUCTION: This exam was performed according to the departmental dose-optimization program which includes automated exposure control, adjustment of the mA and/or kV according to patient size and/or use of iterative reconstruction technique. CONTRAST:  OMNIPAQUE  IOHEXOL  350 MG/ML SOLN COMPARISON:  02/25/2024 FINDINGS: Cardiovascular: Cardiomegaly. Aorta normal caliber with scattered calcifications. No filling defects in the pulmonary arteries to suggest pulmonary emboli. Mediastinum/Nodes: No mediastinal, hilar, or axillary adenopathy. Trachea and esophagus are unremarkable. Thyroid unremarkable. Lungs/Pleura: Predominantly linear opacities in the lung bases most compatible with atelectasis. Trace bilateral pleural effusions. Right mid lung atelectasis anteriorly. Upper Abdomen: No acute findings Musculoskeletal: Chest wall soft tissues are unremarkable. No acute bony abnormality. Review of the MIP images confirms the above findings.  IMPRESSION: No evidence of pulmonary embolus. Cardiomegaly. Trace bilateral pleural effusions with right mid lung and bibasilar atelectasis. Aortic Atherosclerosis (ICD10-I70.0). Electronically Signed   By: Franky Crease M.D.   On: 03/27/2024 00:46   DG Chest 2 View Result Date: 03/26/2024 CLINICAL DATA:  Chest pain and shortness of breath. The pain is midsternal and radiates down her back. EXAM: CHEST - 2 VIEW COMPARISON:  02/25/2024.  Chest CT dated 02/25/2024. FINDINGS: Stable enlarged cardiac silhouette and prominent pulmonary vasculature. Clear lungs. Small amount of bilateral pleural fluid or thickening posteriorly. There was a commensurate degree of pleural thickening on the recent CT. Thoracic spine degenerative changes including changes of DISH. IMPRESSION: 1. Stable cardiomegaly and pulmonary vascular congestion. 2. Small amount of bilateral pleural fluid or thickening. There was corresponding thickening on the recent CT. Electronically Signed   By: Elspeth Bathe M.D.   On: 03/26/2024 15:53   Data Reviewed for HPI: Relevant notes from primary care and specialist visits, past discharge summaries as available in EHR, including Care Everywhere. Prior diagnostic testing as pertinent to current admission diagnoses Updated medications and problem lists for reconciliation ED course, including vitals, labs, imaging, treatment and response to treatment Triage notes, nursing and pharmacy notes and ED provider's notes Notable results as noted above in HPI      Assessment and Plan: Acute dyspnea Suspect new onset CHF Pulmonary hypertension Possible OSA/OHS Suspecting multifactorial etiology but patient clinically volume up Has trace pleural effusions.  Normal BNP could be related to obesity Outpatient echo 03/08/2024 with EF 55% mild MR mild TR, indeterminate diastolic parameters IV Lasix  Continue home carvedilol and losartan  Intake and output monitoring Will consult primary  cardiologist Consider pulmonology consult   HTN (hypertension) Continue carvedilol and losartan   Pulmonary hypertension (HCC) Consider pulmonology consult  Chronic obstructive pulmonary disease (COPD) (HCC) No wheezing heard on exam DuoNebs as needed  Obesity, Class III, BMI 40-49.9 (morbid obesity) Complicating factor to overall prognosis and care  Diabetes mellitus without complication (HCC) Sliding scale insulin  coverage     DVT prophylaxis: Lovenox   Consults: [facility] cardiology  Advance Care Planning: full code  Family Communication: none  Disposition Plan: Back to previous home environment  Severity of Illness: The appropriate patient status for this patient is OBSERVATION. Observation status is judged to be reasonable and necessary in order to provide the required intensity of service to ensure the patient's safety. The patient's presenting symptoms, physical exam findings, and initial radiographic and laboratory data in the context of their medical condition is felt to place them at decreased risk for further clinical deterioration. Furthermore, it is anticipated that the patient will be medically stable for discharge from the hospital within 2 midnights of admission.   Author: Delayne LULLA Solian, MD 03/27/2024 2:10 AM  For on call review [email or web address].

## 2024-03-27 NOTE — Assessment & Plan Note (Addendum)
 Suspect new onset CHF Pulmonary hypertension Possible OSA/OHS Suspecting multifactorial etiology but patient clinically volume up Has trace pleural effusions.  Normal BNP could be related to obesity Outpatient echo 03/08/2024 with EF 55% mild MR mild TR, indeterminate diastolic parameters IV Lasix  Continue home carvedilol and losartan  Intake and output monitoring Will consult primary cardiologist Consider pulmonology consult

## 2024-03-27 NOTE — Progress Notes (Addendum)
 Patient was seen and examined at the bedside in the emergency department.  Rankin, RN, was at the bedside.  Interval events noted.  She feels better after IV Lasix  yesterday.  She said the abdominal bloating and leg swelling have improved significantly.  No other complaints.  She has taken prednisone  for 3 days and was prescribed for 5 days. Phenol glycerin is listed as an allergy on her chart but she has tolerated insulin  injections in the past without any allergic reactions.  She also takes Synthroid  at home without any issues.  Vital signs are stable.  Probable CHF exacerbation.  Restart IV Lasix . Resume prednisone  and bronchodilators for recent COPD exacerbation Recommended sleep study for evaluation of sleep apnea. Continue current management.  Appreciate input from cardiologist.

## 2024-03-27 NOTE — ED Notes (Signed)
 Pt placed on 2L  while laying flatter to sleep.

## 2024-03-27 NOTE — Assessment & Plan Note (Signed)
 No wheezing heard on exam DuoNebs as needed

## 2024-03-27 NOTE — ED Notes (Signed)
 Placed patient back on 2L [place]

## 2024-03-27 NOTE — Assessment & Plan Note (Signed)
 Sliding scale insulin  coverage

## 2024-03-28 ENCOUNTER — Encounter: Payer: Self-pay | Admitting: Internal Medicine

## 2024-03-28 DIAGNOSIS — F319 Bipolar disorder, unspecified: Secondary | ICD-10-CM | POA: Insufficient documentation

## 2024-03-28 DIAGNOSIS — I5033 Acute on chronic diastolic (congestive) heart failure: Secondary | ICD-10-CM | POA: Diagnosis not present

## 2024-03-28 LAB — URINALYSIS, COMPLETE (UACMP) WITH MICROSCOPIC
Bilirubin Urine: NEGATIVE
Glucose, UA: NEGATIVE mg/dL
Hgb urine dipstick: NEGATIVE
Ketones, ur: NEGATIVE mg/dL
Nitrite: POSITIVE — AB
Protein, ur: NEGATIVE mg/dL
Specific Gravity, Urine: 1.025 (ref 1.005–1.030)
WBC, UA: >50 WBC/hpf (ref 0–5)
pH: 5 (ref 5.0–8.0)

## 2024-03-28 LAB — GLUCOSE, CAPILLARY
Glucose-Capillary: 119 mg/dL — ABNORMAL HIGH (ref 70–99)
Glucose-Capillary: 212 mg/dL — ABNORMAL HIGH (ref 70–99)
Glucose-Capillary: 181 mg/dL — ABNORMAL HIGH (ref 70–99)
Glucose-Capillary: 228 mg/dL — ABNORMAL HIGH (ref 70–99)
Glucose-Capillary: 157 mg/dL — ABNORMAL HIGH (ref 70–99)

## 2024-03-28 LAB — BASIC METABOLIC PANEL WITH GFR
Anion gap: 8 (ref 5–15)
BUN: 31 mg/dL — ABNORMAL HIGH (ref 8–23)
CO2: 31 mmol/L (ref 22–32)
Calcium: 8.4 mg/dL — ABNORMAL LOW (ref 8.9–10.3)
Chloride: 99 mmol/L (ref 98–111)
Creatinine, Ser: 0.83 mg/dL (ref 0.44–1.00)
GFR, Estimated: >60 mL/min (ref >60)
Glucose, Bld: 137 mg/dL — ABNORMAL HIGH (ref 70–99)
Potassium: 3.6 mmol/L (ref 3.5–5.1)
Sodium: 138 mmol/L (ref 135–145)

## 2024-03-28 LAB — HEMOGLOBIN A1C
Hgb A1c MFr Bld: 7.6 % — ABNORMAL HIGH (ref 4.8–5.6)
Mean Plasma Glucose: 171 mg/dL

## 2024-03-28 MED ORDER — SODIUM CHLORIDE 0.9 % IV SOLN
1.0000 g | INTRAVENOUS | Status: DC
  Administered 2024-03-28: 1 g via INTRAVENOUS
  Filled 2024-03-28 (×2): qty 10

## 2024-03-28 MED ORDER — METHOCARBAMOL 500 MG PO TABS
500.0000 mg | ORAL_TABLET | Freq: Three times a day (TID) | ORAL | Status: DC | PRN
  Administered 2024-03-28 – 2024-03-29 (×2): 500 mg via ORAL
  Filled 2024-03-28 (×2): qty 1

## 2024-03-28 NOTE — Progress Notes (Addendum)
 Progress Note    [name]  FMW:[id number] DOB: January 17, 1956  DOA: 03/26/2024 PCP: Perri Constance Sor, PA-C      Brief Narrative:    Medical records reviewed and are as summarized below:  [name] is a 68 y.o. female with medical history significant for morbid obesity, COPD, diabetes mellitus, hypertension, mild pulmonary hypertension.  She was recently evaluated by the cardiologist on 03/24/2024 for dyspnea on exertion.  She was also seen by her PCP on 03/23/2024 for shortness of breath and was diagnosed with COPD exacerbation and wheezing.  She was prescribed prednisone  and azithromycin for 5 days  She presented to the emergency department on 03/26/2024 because of because of exertional shortness of breath, chest discomfort and abdominal distention and increasing lower extremity edema.  She was admitted to the hospital for exacerbation of likely chronic diastolic CHF.         Assessment/Plan:   Principal Problem:   Acute on chronic diastolic CHF (congestive heart failure) (HCC) Active Problems:   Acute dyspnea   Diabetes mellitus without complication (HCC)   Obesity, Class III, BMI 40-49.9 (morbid obesity)   Chronic obstructive pulmonary disease (COPD) (HCC)   Pulmonary hypertension (HCC)   HTN (hypertension)    Body mass index is 42.64 kg/m.  (Morbid obesity)    Acute on chronic diastolic CHF: Patient was hypervolemic on presentation.  She responded well to IV Lasix .  Change IV to oral Lasix . Recent 2D echo on 03/08/2024 showed EF estimated at greater than 55%, indeterminate diastolic function, normal RV systolic function, mild pulmonary hypertension.   Recent diagnosis of COPD exacerbation prior to admission: Continue bronchodilators. Completed 5 days of prednisone  on 03/29/2023.  She had taken prednisone  and azithromycin for 3 days prior to admission. History of personal tobacco use order and secondhand tobacco exposure   Dyspnea on exertion  most likely multifactorial from CHF, COPD, morbid obesity and pulmonary hypertension   Chronic hypoxic respiratory failure: Oxygen saturation dropped to 88% on room air.  Patient will need home oxygen.   Acute UTI, dysuria: Start IV ceftriaxone .  Urine cultures has been ordered because of recurrent UTIs in the past.   Type II DM with hyperglycemia: Use NovoLog  as needed for hyperglycemia.   Morbid obesity: This complicates overall care and prognosis.  Recommended outpatient sleep study for evaluation of sleep apnea.   Comorbidities include hypertension, hyperlipidemia, hypothyroidism, depression, anxiety, arthritis, bipolar disorder   Diet Order             Diet heart healthy/carb modified Room service appropriate? Yes; Fluid consistency: Thin  Diet effective now                                  Consultants: Cardiologist  Procedures: None    Medications:    budesonide -glycopyrrolate -formoterol   2 puff Inhalation BID   enoxaparin  (LOVENOX ) injection  60 mg Subcutaneous Q24H   insulin  aspart  0-15 Units Subcutaneous TID WC   insulin  aspart  0-5 Units Subcutaneous QHS   levothyroxine   175 mcg Oral QAC breakfast   losartan   50 mg Oral Daily   pantoprazole   40 mg Oral BID   predniSONE   40 mg Oral Q breakfast   Continuous Infusions:  cefTRIAXone  (ROCEPHIN )  IV 1 g (03/28/24 1533)     Anti-infectives (From admission, onward)    Start     Dose/Rate Route Frequency Ordered Stop  03/28/24 1430  cefTRIAXone  (ROCEPHIN ) 1 g in sodium chloride  0.9 % 100 mL IVPB        1 g 200 mL/hr over 30 Minutes Intravenous Every 24 hours 03/28/24 1341                Family Communication/Anticipated D/C date and plan/Code Status   DVT prophylaxis:      Code Status: Full Code  Family Communication: Plan discussed with [name], daughter, over the phone plan Disposition Plan: Plan to discharge home   Status is: Inpatient Remains inpatient  appropriate because: Acute UTI, CHF exacerbation       Subjective:   Interval events noted.  She complains of pain in urination.  Suspect she has a UTI because she has had recurrent UTIs in the past.  Shortness of breath has improved.  Body swelling has improved as well.  She requested something for muscle spasms or leg cramps  that normally okay with IV diuresis..  Objective:    Vitals:   03/28/24 0530 03/28/24 0814 03/28/24 1155 03/28/24 1559  BP: 133/89 (!) 130/94 (!) 144/78 (!) 164/86  Pulse: 68 67 69 73  Resp: 18 20 20 20   Temp: 97.7 F (36.5 C) 97.6 F (36.4 C) 98.2 F (36.8 C) 98 F (36.7 C)  TempSrc:   Oral   SpO2: 96% 96% 90% 93%  Weight:      Height:       No data found.   Intake/Output Summary (Last 24 hours) at 03/28/2024 1713 Last data filed at 03/28/2024 1330 Gross per 24 hour  Intake 480 ml  Output --  Net 480 ml   Filed Weights   03/26/24 1425 03/28/24 0500  Weight: 122.5 kg 123.5 kg    Exam:  GEN: NAD SKIN: Warm and dry EYES: No pallor or icterus ENT: MMM CV: RRR PULM: CTA B ABD: soft, obese, NT, +BS CNS: AAO x 3, non focal EXT: Bilateral leg edema has improved.  No erythema or tenderness.        Data Reviewed:   I have personally reviewed following labs and imaging studies:  Labs: Labs show the following:   Basic Metabolic Panel: Recent Labs  Lab 03/26/24 1427 03/27/24 0924 03/27/24 1732 03/28/24 0453  NA 137 140  --  138  K 4.1 4.0 4.1 3.6  CL 100 98  --  99  CO2 26 31  --  31  GLUCOSE 231* 147*  --  137*  BUN 36* 29*  --  31*  CREATININE 0.93 0.77  --  0.83  CALCIUM 9.2 8.3*  --  8.4*  MG  --   --  2.0  --    GFR Estimated Creatinine Clearance: 92.2 mL/min (by C-G formula based on SCr of 0.83 mg/dL). Liver Function Tests: No results for input(s): AST, ALT, ALKPHOS, BILITOT, PROT, ALBUMIN in the last 168 hours. No results for input(s): LIPASE, AMYLASE in the last 168 hours. No results for  input(s): AMMONIA in the last 168 hours. Coagulation profile No results for input(s): INR, PROTIME in the last 168 hours.  CBC: Recent Labs  Lab 03/26/24 1427  WBC 8.8  HGB 10.4*  HCT 34.8*  MCV 87.9  PLT 314   Cardiac Enzymes: No results for input(s): CKTOTAL, CKMB, CKMBINDEX, TROPONINI in the last 168 hours. BNP (last 3 results) No results for input(s): PROBNP in the last 8760 hours. CBG: Recent Labs  Lab 03/27/24 2201 03/28/24 0531 03/28/24 0811 03/28/24 1208 03/28/24 1556  GLUCAP  110* 119* 212* 181* 228*   D-Dimer: No results for input(s): DDIMER in the last 72 hours. Hgb A1c: Recent Labs    03/27/24 0924  HGBA1C 7.6*   Lipid Profile: No results for input(s): CHOL, HDL, LDLCALC, TRIG, CHOLHDL, LDLDIRECT in the last 72 hours. Thyroid function studies: No results for input(s): TSH, T4TOTAL, T3FREE, THYROIDAB in the last 72 hours.  Invalid input(s): FREET3 Anemia work up: No results for input(s): VITAMINB12, FOLATE, FERRITIN, TIBC, IRON, RETICCTPCT in the last 72 hours. Sepsis Labs: Recent Labs  Lab 03/26/24 1427  WBC 8.8    Microbiology No results found for this or any previous visit (from the past 240 hours).  Procedures and diagnostic studies:  CT Angio Chest PE W/Cm &/Or Wo Cm Result Date: 03/27/2024 CLINICAL DATA:  Pulmonary embolism (PE) suspected, high prob. Shortness of breath, chest pain EXAM: CT ANGIOGRAPHY CHEST WITH CONTRAST TECHNIQUE: Multidetector CT imaging of the chest was performed using the standard protocol during bolus administration of intravenous contrast. Multiplanar CT image reconstructions and MIPs were obtained to evaluate the vascular anatomy. RADIATION DOSE REDUCTION: This exam was performed according to the departmental dose-optimization program which includes automated exposure control, adjustment of the mA and/or kV according to patient size and/or use of iterative  reconstruction technique. CONTRAST:  OMNIPAQUE  IOHEXOL  350 MG/ML SOLN COMPARISON:  02/25/2024 FINDINGS: Cardiovascular: Cardiomegaly. Aorta normal caliber with scattered calcifications. No filling defects in the pulmonary arteries to suggest pulmonary emboli. Mediastinum/Nodes: No mediastinal, hilar, or axillary adenopathy. Trachea and esophagus are unremarkable. Thyroid unremarkable. Lungs/Pleura: Predominantly linear opacities in the lung bases most compatible with atelectasis. Trace bilateral pleural effusions. Right mid lung atelectasis anteriorly. Upper Abdomen: No acute findings Musculoskeletal: Chest wall soft tissues are unremarkable. No acute bony abnormality. Review of the MIP images confirms the above findings. IMPRESSION: No evidence of pulmonary embolus. Cardiomegaly. Trace bilateral pleural effusions with right mid lung and bibasilar atelectasis. Aortic Atherosclerosis (ICD10-I70.0). Electronically Signed   By: Franky Crease M.D.   On: 03/27/2024 00:46               LOS: 1 day   Markus Casten  [facility]   Pager on [email or web address]. If 7PM-7AM, please contact night-coverage at [email or web address]     03/28/2024, 5:13 PM

## 2024-03-28 NOTE — Plan of Care (Signed)

## 2024-03-28 NOTE — Progress Notes (Signed)
 Met with pt- pt expressed strong desire to get paperwork for HCPOA filled out and notarized before she is discharged. Pt wants to appoint daughter-she was on the phone when I entered, but allowed me to briefly explain process-she shared if someone could return in the morning that would be helpful to complete it then.

## 2024-03-28 NOTE — Progress Notes (Signed)
 MD contacted re: Pt has concerns of a UTI w/ urinary discomfort & frequency States usually has 4-5 UTI's a year which is alleviated w/ rotating antibiotic courses

## 2024-03-28 NOTE — Progress Notes (Signed)
 Pt oxygen saturation 90% on room air at rest. Pt ambulated 100 ft using a front wheel walker on room air, oxygen saturation 88%. Pt returned to room and sat in chair,oxygen saturation 90% on room air. Pt c/o dizziness when returning to room. Dr. Jens notified. Will continue to monitor pt.

## 2024-03-28 NOTE — Progress Notes (Signed)
 [facility] CLINIC CARDIOLOGY PROGRESS NOTE       Patient ID: [name] MRN: [id number] DOB/AGE: March 23, 1956 68 y.o.  Admit date: 03/26/2024 Referring Physician Dr. Delayne Solian Primary Physician Perri, Mychal Burnard, PA-C  Primary Cardiologist Dr. Wilburn  Reason for Consultation ?AoCHF  HPI: [name] is a 68 y.o. female  with a past medical history of tobacco use, COPD, diabetes, hypertension who presented to the ED on 03/26/2024 for dizziness and headache. Also with exertional dyspnea recently BNP normal. Cardiology was consulted for further evaluation.   Interval history: - Patient seen and examined this morning, resting comfortably in hospital bed. - Was put on some up supplemental O2 overnight yesterday. - Endorses good UOP with IV Lasix  but endorses some dysuria symptoms today.  Renal function remained stable. - Heart rate controlled, BP mildly elevated today.  Review of systems complete and found to be negative unless listed above    Past Medical History:  Diagnosis Date   Anxiety    Arthritis    Barrett esophagus    Barrett's esophagus    Bipolar affective disorder, manic (HCC)    Bipolar disorder (HCC)    DDD (degenerative disc disease), lumbar    Depression    Diabetes mellitus without complication (HCC)    Exposure of vaginal mesh through vaginal wall (HCC)    Fatty liver    GERD (gastroesophageal reflux disease)    Glenohumeral arthritis    History of kidney stones    Hypercholesterolemia    Hypercholesterolemia    Hypertension    Hypothyroidism    Lichen sclerosus    Nephrolithiasis    PTSD (post-traumatic stress disorder)    Radiculopathy of lumbosacral region    RLS (restless legs syndrome)    Sciatica    Thyroid disease    Urge urinary incontinence     Past Surgical History:  Procedure Laterality Date   BIOPSY  07/24/2023   Procedure: BIOPSY;  Surgeon: Onita Elspeth Sharper, DO;  Location: [facility] ENDOSCOPY;  Service: Gastroenterology;;    BLADDER SUSPENSION  2009   BREAST EXCISIONAL BIOPSY Left 04/11/2008   neg   BREAST SURGERY     CESAREAN SECTION     COLONOSCOPY WITH PROPOFOL  N/A 12/23/2017   Procedure: COLONOSCOPY WITH PROPOFOL ;  Surgeon: Gaylyn Gladis PENNER, MD;  Location: [facility] ENDOSCOPY;  Service: Endoscopy;  Laterality: N/A;   COLONOSCOPY WITH PROPOFOL  N/A 07/24/2023   Procedure: COLONOSCOPY WITH PROPOFOL ;  Surgeon: Onita Elspeth Sharper, DO;  Location: [facility] ENDOSCOPY;  Service: Gastroenterology;  Laterality: N/A;   CYSTOCELE REPAIR     CYSTOURETHROSCOPY     ESOPHAGOGASTRODUODENOSCOPY (EGD) WITH PROPOFOL  N/A 12/23/2017   Procedure: ESOPHAGOGASTRODUODENOSCOPY (EGD) WITH PROPOFOL ;  Surgeon: Gaylyn Gladis PENNER, MD;  Location: [facility] ENDOSCOPY;  Service: Endoscopy;  Laterality: N/A;   ESOPHAGOGASTRODUODENOSCOPY (EGD) WITH PROPOFOL  N/A 07/24/2023   Procedure: ESOPHAGOGASTRODUODENOSCOPY (EGD) WITH PROPOFOL ;  Surgeon: Onita Elspeth Sharper, DO;  Location: [facility] ENDOSCOPY;  Service: Gastroenterology;  Laterality: N/A;   HERNIA REPAIR     JOINT REPLACEMENT     LAPAROSCOPIC SALPINGOOPHERECTOMY Left 1976   MENISCECTOMY Bilateral 1980, 2000   OOPHORECTOMY Left    PERCUTANEOUS NEPHROSTOLITHOTOMY     POLYPECTOMY  07/24/2023   Procedure: POLYPECTOMY;  Surgeon: Onita Elspeth Sharper, DO;  Location: [facility] ENDOSCOPY;  Service: Gastroenterology;;   REPLACEMENT TOTAL KNEE Left 2010   TUBAL LIGATION      Medications Prior to Admission  Medication Sig Dispense Refill Last Dose/Taking   albuterol  (PROVENTIL  HFA;VENTOLIN  HFA) 108 (90 Base)  MCG/ACT inhaler Inhale 2 puffs into the lungs every 6 (six) hours as needed for wheezing or shortness of breath. 1 Inhaler 0 03/26/2024   azithromycin (ZITHROMAX) 250 MG tablet Take 250 mg by mouth daily.   03/26/2024   baclofen  (LIORESAL ) 10 MG tablet Take 1 tablet (10 mg total) by mouth at bedtime as needed. 15 each 0 Unknown   clobetasol ointment (TEMOVATE) 0.05 % Apply 1 Application topically 2 (two)  times daily.   03/26/2024   estradiol (ESTRACE) 0.1 MG/GM vaginal cream Place 1 g vaginally 2 (two) times a week.   Past Week   fexofenadine  (ALLEGRA ) 180 MG tablet Take 1 tablet (180 mg total) by mouth daily. 30 tablet 1 Past Week   fluticasone (FLONASE) 50 MCG/ACT nasal spray Place 2 sprays into both nostrils daily.   Past Week   furosemide  (LASIX ) 20 MG tablet Take 20 mg by mouth daily.   Past Week   ipratropium-albuterol  (DUONEB) 0.5-2.5 (3) MG/3ML SOLN Inhale 3 mLs into the lungs every 6 (six) hours as needed (WHEEZING).   03/26/2024   levothyroxine  (SYNTHROID , LEVOTHROID) 175 MCG tablet Take 175 mcg by mouth daily.   03/26/2024 Morning   losartan  (COZAAR ) 50 MG tablet Take 50 mg by mouth daily.   03/27/2024 Morning   metFORMIN  (GLUCOPHAGE -XR) 500 MG 24 hr tablet Take 1,000 mg by mouth 2 (two) times daily with a meal.   03/26/2024 Morning   pantoprazole  (PROTONIX ) 40 MG tablet Take 1 tablet by mouth 2 (two) times daily.   03/26/2024 Morning   predniSONE  (DELTASONE ) 50 MG tablet Take 50 mg by mouth daily with breakfast.   03/26/2024 Morning   Semaglutide  14 MG TABS Take 14 mg by mouth daily.   03/26/2024 Morning   TRELEGY ELLIPTA 200-62.5-25 MCG/ACT AEPB Inhale 1 puff into the lungs daily.   03/26/2024   Elastic Bandages & Supports (T.E.D. KNEE LENGTH/XL-LONG) MISC 2 each by Does not apply route daily as needed. 2 each 0    FEROSUL 325 (65 Fe) MG tablet Take 325 mg by mouth every morning.      fluticasone-salmeterol (ADVAIR) 250-50 MCG/ACT AEPB Inhale 1 puff into the lungs every 12 (twelve) hours. (Patient not taking: Reported on 03/27/2024)   Not Taking   magnesium oxide (MAG-OX) 400 (240 Mg) MG tablet Take 400 mg by mouth daily.      prazosin (MINIPRESS) 1 MG capsule Take 1 mg by mouth at bedtime. (Patient not taking: Reported on 03/27/2024)   Not Taking   trospium (SANCTURA) 20 MG tablet Take 20 mg by mouth 2 (two) times daily. (Patient not taking: Reported on 03/27/2024)   Not Taking   Social History    Socioeconomic History   Marital status: Widowed    Spouse name: Not on file   Number of children: Not on file   Years of education: Not on file   Highest education level: Not on file  Occupational History   Not on file  Tobacco Use   Smoking status: Former    Current packs/day: 0.50    Average packs/day: 0.5 packs/day for 10.0 years (5.0 ttl pk-yrs)    Types: Cigarettes   Smokeless tobacco: Never   Tobacco comments:    few cigarettes per week  Vaping Use   Vaping status: Never Used  Substance and Sexual Activity   Alcohol use: Not Currently    Comment: rarely   Drug use: Not Currently    Types: Marijuana    Comment: once in a while  Sexual activity: Not on file  Other Topics Concern   Not on file  Social History Narrative   Not on file   Social Drivers of Health   Financial Resource Strain: Low Risk  (03/24/2024)   Received from [facility]   Overall Financial Resource Strain (CARDIA)    Difficulty of Paying Living Expenses: Not very hard  Food Insecurity: No Food Insecurity (03/27/2024)   Hunger Vital Sign    Worried About Running Out of Food in the Last Year: Never true    Ran Out of Food in the Last Year: Never true  Transportation Needs: No Transportation Needs (03/27/2024)   PRAPARE - Administrator, Civil Service (Medical): No    Lack of Transportation (Non-Medical): No  Physical Activity: Not on file  Stress: Not on file  Social Connections: Moderately Isolated (03/27/2024)   Social Connection and Isolation Panel    Frequency of Communication with Friends and Family: Three times a week    Frequency of Social Gatherings with Friends and Family: Once a week    Attends Religious Services: More than 4 times per year    Active Member of [employer] or Organizations: No    Attends Banker Meetings: Never    Marital Status: Widowed  Intimate Partner Violence: Not At Risk (03/27/2024)   Humiliation, Afraid, Rape, and Kick  questionnaire    Fear of Current or Ex-Partner: No    Emotionally Abused: No    Physically Abused: No    Sexually Abused: No    Family History  Problem Relation Age of Onset   Breast cancer Sister 79       half sister on fathers side   Breast cancer Maternal Grandmother    Breast cancer Niece      Vitals:   03/28/24 0007 03/28/24 0500 03/28/24 0530 03/28/24 0814  BP: 136/68  133/89 (!) 130/94  Pulse: 74  68 67  Resp: 20  18 20   Temp: 97.9 F (36.6 C)  97.7 F (36.5 C) 97.6 F (36.4 C)  TempSrc:      SpO2: 94%  96% 96%  Weight:  123.5 kg    Height:        PHYSICAL EXAM General: Well appearing female, well nourished, in no acute distress. HEENT: Normocephalic and atraumatic. Neck: No JVD.  Lungs: Normal respiratory effort on room air. Clear bilaterally to auscultation. No wheezes, crackles, rhonchi.  Heart: HRRR. Normal S1 and S2 without gallops or murmurs.  Abdomen: Non-distended appearing.  Msk: Normal strength and tone for age. Extremities: Warm and well perfused. No clubbing, cyanosis. No edema.  Neuro: Alert and oriented X 3. Psych: Answers questions appropriately.   Labs: Basic Metabolic Panel: Recent Labs    03/27/24 0924 03/27/24 1732 03/28/24 0453  NA 140  --  138  K 4.0 4.1 3.6  CL 98  --  99  CO2 31  --  31  GLUCOSE 147*  --  137*  BUN 29*  --  31*  CREATININE 0.77  --  0.83  CALCIUM 8.3*  --  8.4*  MG  --  2.0  --    Liver Function Tests: No results for input(s): AST, ALT, ALKPHOS, BILITOT, PROT, ALBUMIN in the last 72 hours. No results for input(s): LIPASE, AMYLASE in the last 72 hours. CBC: Recent Labs    03/26/24 1427  WBC 8.8  HGB 10.4*  HCT 34.8*  MCV 87.9  PLT 314   Cardiac Enzymes:  Recent Labs    03/26/24 1427 03/26/24 1724  TROPONINIHS 26* 28*   BNP: Recent Labs    03/26/24 1427  BNP 86.9   D-Dimer: No results for input(s): DDIMER in the last 72 hours. Hemoglobin A1C: Recent Labs     03/27/24 0924  HGBA1C 7.6*   Fasting Lipid Panel: No results for input(s): CHOL, HDL, LDLCALC, TRIG, CHOLHDL, LDLDIRECT in the last 72 hours. Thyroid Function Tests: No results for input(s): TSH, T4TOTAL, T3FREE, THYROIDAB in the last 72 hours.  Invalid input(s): FREET3 Anemia Panel: No results for input(s): VITAMINB12, FOLATE, FERRITIN, TIBC, IRON, RETICCTPCT in the last 72 hours.   Radiology: CT Angio Chest PE W/Cm &/Or Wo Cm Result Date: 03/27/2024 CLINICAL DATA:  Pulmonary embolism (PE) suspected, high prob. Shortness of breath, chest pain EXAM: CT ANGIOGRAPHY CHEST WITH CONTRAST TECHNIQUE: Multidetector CT imaging of the chest was performed using the standard protocol during bolus administration of intravenous contrast. Multiplanar CT image reconstructions and MIPs were obtained to evaluate the vascular anatomy. RADIATION DOSE REDUCTION: This exam was performed according to the departmental dose-optimization program which includes automated exposure control, adjustment of the mA and/or kV according to patient size and/or use of iterative reconstruction technique. CONTRAST:  OMNIPAQUE  IOHEXOL  350 MG/ML SOLN COMPARISON:  02/25/2024 FINDINGS: Cardiovascular: Cardiomegaly. Aorta normal caliber with scattered calcifications. No filling defects in the pulmonary arteries to suggest pulmonary emboli. Mediastinum/Nodes: No mediastinal, hilar, or axillary adenopathy. Trachea and esophagus are unremarkable. Thyroid unremarkable. Lungs/Pleura: Predominantly linear opacities in the lung bases most compatible with atelectasis. Trace bilateral pleural effusions. Right mid lung atelectasis anteriorly. Upper Abdomen: No acute findings Musculoskeletal: Chest wall soft tissues are unremarkable. No acute bony abnormality. Review of the MIP images confirms the above findings. IMPRESSION: No evidence of pulmonary embolus. Cardiomegaly. Trace bilateral pleural effusions with right  mid lung and bibasilar atelectasis. Aortic Atherosclerosis (ICD10-I70.0). Electronically Signed   By: Franky Crease M.D.   On: 03/27/2024 00:46   DG Chest 2 View Result Date: 03/26/2024 CLINICAL DATA:  Chest pain and shortness of breath. The pain is midsternal and radiates down her back. EXAM: CHEST - 2 VIEW COMPARISON:  02/25/2024.  Chest CT dated 02/25/2024. FINDINGS: Stable enlarged cardiac silhouette and prominent pulmonary vasculature. Clear lungs. Small amount of bilateral pleural fluid or thickening posteriorly. There was a commensurate degree of pleural thickening on the recent CT. Thoracic spine degenerative changes including changes of DISH. IMPRESSION: 1. Stable cardiomegaly and pulmonary vascular congestion. 2. Small amount of bilateral pleural fluid or thickening. There was corresponding thickening on the recent CT. Electronically Signed   By: Elspeth Bathe M.D.   On: 03/26/2024 15:53    ECHO 02/2024: NORMAL LEFT VENTRICULAR SYSTOLIC FUNCTION WITH NO LVH  ESTIMATED EF: >55%, CALC EF(2D): 59%  INDETERMINATE DIASTOLIC FUNCTION  NORMAL RIGHT VENTRICULAR SYSTOLIC FUNCTION  VALVULAR REGURGITATION: No AR, MILD MR, TRIVIAL PR, MILD TR  ESTIMATED RVSP: 45 mmHg (ABNORMAL)  NO VALVULAR STENOSIS   TELEMETRY reviewed by me 03/28/2024: NSR rate 60s  EKG reviewed by me: NSR rate 78 bpm  Data reviewed by me 03/28/2024: last 24h vitals tele labs imaging I/O ED provider note, admission H&P, hospitalist progress note  Principal Problem:   CHF (congestive heart failure) (HCC) Active Problems:   Diabetes mellitus without complication (HCC)   Obesity, Class III, BMI 40-49.9 (morbid obesity)   Chronic obstructive pulmonary disease (COPD) (HCC)   Pulmonary hypertension (HCC)   HTN (hypertension)   Acute dyspnea    ASSESSMENT AND  PLAN:  [name] is a 68 y.o. female  with a past medical history of tobacco use, COPD, diabetes, hypertension who presented to the ED on 03/26/2024 for dizziness and  headache. Also with exertional dyspnea recently BNP normal. Cardiology was consulted for further evaluation.   # Exertional dyspnea # COPD # Mild pulmonary hypertension # Headaches, dizziness # Hypertension Patient with recent symptoms of headaches, dizziness, exertional dyspnea presented to the ED for further evaluation.  BNP normal limits.  CTA chest demonstrated trace pleural effusions bilaterally with no evidence of PE.  She was started on IV Lasix  in the ED as well as steroids and nebulizers. - IV Lasix  discontinued today due to dysuria symptoms.  Would recommend starting on p.o. Lasix  20 mg daily on discharge. - Further management of COPD as per primary team.  Patient should follow-up as scheduled outpatient with pulmonology. - Would benefit from evaluation for obstructive sleep apnea. - Continue losartan  50 mg daily.  Can consider addition of MRA in future for additional BP control.   This patient's plan of care was discussed and created with Dr. Florencio and he is in agreement.  Signed: Danita Bloch, PA-C  03/28/2024, 9:19 AM [facility] Cardiology

## 2024-03-28 NOTE — Plan of Care (Signed)
  Problem: Fluid Volume: Goal: Ability to maintain a balanced intake and output will improve Outcome: Progressing   Problem: Education: Goal: Knowledge of General Education information will improve Description: Including pain rating scale, medication(s)/side effects and non-pharmacologic comfort measures Outcome: Progressing   Problem: Health Behavior/Discharge Planning: Goal: Ability to manage health-related needs will improve Outcome: Progressing   Problem: Clinical Measurements: Goal: Respiratory complications will improve Outcome: Progressing Goal: Cardiovascular complication will be avoided Outcome: Progressing

## 2024-03-29 ENCOUNTER — Other Ambulatory Visit: Payer: Self-pay

## 2024-03-29 DIAGNOSIS — J9601 Acute respiratory failure with hypoxia: Secondary | ICD-10-CM | POA: Insufficient documentation

## 2024-03-29 DIAGNOSIS — I5033 Acute on chronic diastolic (congestive) heart failure: Secondary | ICD-10-CM | POA: Diagnosis not present

## 2024-03-29 LAB — BASIC METABOLIC PANEL WITH GFR
Anion gap: 9 (ref 5–15)
BUN: 32 mg/dL — ABNORMAL HIGH (ref 8–23)
CO2: 29 mmol/L (ref 22–32)
Calcium: 8.5 mg/dL — ABNORMAL LOW (ref 8.9–10.3)
Chloride: 103 mmol/L (ref 98–111)
Creatinine, Ser: 0.89 mg/dL (ref 0.44–1.00)
GFR, Estimated: >60 mL/min (ref >60)
Glucose, Bld: 133 mg/dL — ABNORMAL HIGH (ref 70–99)
Potassium: 4.1 mmol/L (ref 3.5–5.1)
Sodium: 141 mmol/L (ref 135–145)

## 2024-03-29 LAB — CBC
HCT: 35.4 % — ABNORMAL LOW (ref 36.0–46.0)
Hemoglobin: 10.9 g/dL — ABNORMAL LOW (ref 12.0–15.0)
MCH: 26.5 pg (ref 26.0–34.0)
MCHC: 30.8 g/dL (ref 30.0–36.0)
MCV: 85.9 fL (ref 80.0–100.0)
Platelets: 321 K/uL (ref 150–400)
RBC: 4.12 MIL/uL (ref 3.87–5.11)
RDW: 16.2 % — ABNORMAL HIGH (ref 11.5–15.5)
WBC: 10.8 K/uL — ABNORMAL HIGH (ref 4.0–10.5)
nRBC: 0 % (ref 0.0–0.2)

## 2024-03-29 LAB — GLUCOSE, CAPILLARY
Glucose-Capillary: 128 mg/dL — ABNORMAL HIGH (ref 70–99)
Glucose-Capillary: 145 mg/dL — ABNORMAL HIGH (ref 70–99)

## 2024-03-29 MED ORDER — SPIRONOLACTONE 25 MG PO TABS: 25.0000 mg | ORAL_TABLET | Freq: Every day | ORAL | 0 refills | Status: AC

## 2024-03-29 MED ORDER — LOSARTAN POTASSIUM 50 MG PO TABS
75.0000 mg | ORAL_TABLET | Freq: Every day | ORAL | Status: DC
  Administered 2024-03-29: 75 mg via ORAL
  Filled 2024-03-29: qty 1

## 2024-03-29 MED ORDER — FUROSEMIDE 20 MG PO TABS
20.0000 mg | ORAL_TABLET | Freq: Every day | ORAL | Status: DC
  Administered 2024-03-29: 20 mg via ORAL
  Filled 2024-03-29: qty 1

## 2024-03-29 MED ORDER — CEPHALEXIN 500 MG PO CAPS: 500.0000 mg | ORAL_CAPSULE | Freq: Four times a day (QID) | ORAL | 0 refills | Status: AC

## 2024-03-29 MED ORDER — SPIRONOLACTONE 25 MG PO TABS
25.0000 mg | ORAL_TABLET | Freq: Every day | ORAL | Status: DC
  Administered 2024-03-29: 25 mg via ORAL
  Filled 2024-03-29: qty 1

## 2024-03-29 MED ORDER — SODIUM CHLORIDE 0.9 % IV SOLN
2.0000 g | INTRAVENOUS | Status: DC
  Administered 2024-03-29: 2 g via INTRAVENOUS
  Filled 2024-03-29: qty 20

## 2024-03-29 MED ORDER — LOSARTAN POTASSIUM 50 MG PO TABS: 75.0000 mg | ORAL_TABLET | Freq: Every day | ORAL | 0 refills | Status: DC

## 2024-03-29 MED ORDER — FLUCONAZOLE 150 MG PO TABS
150.0000 mg | ORAL_TABLET | Freq: Once | ORAL | 0 refills | Status: AC | PRN
  Filled 2024-03-29: qty 1, 1d supply, fill #0

## 2024-03-29 NOTE — Progress Notes (Signed)
   03/29/24 1400  Spiritual Encounters  Type of Visit Follow up  Care provided to: Patient  Reason for visit Advance directives  OnCall Visit Yes  Interventions  Spiritual Care Interventions Made Compassionate presence  Intervention Outcomes  Outcomes Connection to spiritual care   Patient wanted an Advance Directive before being discharged. With the help of another Chaplain a Notary and 2 witnesses were secured. The Advance  Directive was completed.

## 2024-03-29 NOTE — Progress Notes (Signed)
 [facility] CLINIC CARDIOLOGY PROGRESS NOTE       Patient ID: [name] MRN: [id number] DOB/AGE: 1955/09/13 68 y.o.  Admit date: 03/26/2024 Referring Physician Dr. Delayne Solian Primary Physician Perri, Mychal Burnard, PA-C  Primary Cardiologist Dr. Wilburn  Reason for Consultation ?AoCHF  HPI: [name] is a 68 y.o. female  with a past medical history of tobacco use, COPD, diabetes, hypertension who presented to the ED on 03/26/2024 for dizziness and headache. Also with exertional dyspnea recently BNP normal. Cardiology was consulted for further evaluation.   Interval history: - Patient seen and examined this morning, resting comfortably in hospital bed. -Weaned to room air with stable SpO2.  -Denies any more dysuria. Denies SOB or CP.  -Heart rate controlled, BP elevated today. -Patient eager to go home.  Review of systems complete and found to be negative unless listed above    Past Medical History:  Diagnosis Date   Anxiety    Arthritis    Barrett esophagus    Barrett's esophagus    Bipolar affective disorder, manic (HCC)    Bipolar disorder (HCC)    DDD (degenerative disc disease), lumbar    Depression    Diabetes mellitus without complication (HCC)    Exposure of vaginal mesh through vaginal wall (HCC)    Fatty liver    GERD (gastroesophageal reflux disease)    Glenohumeral arthritis    History of kidney stones    Hypercholesterolemia    Hypercholesterolemia    Hypertension    Hypothyroidism    Lichen sclerosus    Nephrolithiasis    PTSD (post-traumatic stress disorder)    Radiculopathy of lumbosacral region    RLS (restless legs syndrome)    Sciatica    Thyroid disease    Urge urinary incontinence     Past Surgical History:  Procedure Laterality Date   BIOPSY  07/24/2023   Procedure: BIOPSY;  Surgeon: Onita Elspeth Sharper, DO;  Location: [facility] ENDOSCOPY;  Service: Gastroenterology;;   BLADDER SUSPENSION  2009   BREAST EXCISIONAL BIOPSY Left  04/11/2008   neg   BREAST SURGERY     CESAREAN SECTION     COLONOSCOPY WITH PROPOFOL  N/A 12/23/2017   Procedure: COLONOSCOPY WITH PROPOFOL ;  Surgeon: Gaylyn Gladis PENNER, MD;  Location: [facility] ENDOSCOPY;  Service: Endoscopy;  Laterality: N/A;   COLONOSCOPY WITH PROPOFOL  N/A 07/24/2023   Procedure: COLONOSCOPY WITH PROPOFOL ;  Surgeon: Onita Elspeth Sharper, DO;  Location: [facility] ENDOSCOPY;  Service: Gastroenterology;  Laterality: N/A;   CYSTOCELE REPAIR     CYSTOURETHROSCOPY     ESOPHAGOGASTRODUODENOSCOPY (EGD) WITH PROPOFOL  N/A 12/23/2017   Procedure: ESOPHAGOGASTRODUODENOSCOPY (EGD) WITH PROPOFOL ;  Surgeon: Gaylyn Gladis PENNER, MD;  Location[facility] ENDOSCOPY;  Service: Endoscopy;  Laterality: N/A;   ESOPHAGOGASTRODUODENOSCOPY (EGD) WITH PROPOFOL  N/A 07/24/2023   Procedure: ESOPHAGOGASTRODUODENOSCOPY (EGD) WITH PROPOFOL ;  Surgeon: Onita Elspeth Sharper, DO;  Location: [facility] ENDOSCOPY;  Service: Gastroenterology;  Laterality: N/A;   HERNIA REPAIR     JOINT REPLACEMENT     LAPAROSCOPIC SALPINGOOPHERECTOMY Left 1976   MENISCECTOMY Bilateral 1980, 2000   OOPHORECTOMY Left    PERCUTANEOUS NEPHROSTOLITHOTOMY     POLYPECTOMY  07/24/2023   Procedure: POLYPECTOMY;  Surgeon: Onita Elspeth Sharper, DO;  Location: [facility] ENDOSCOPY;  Service: Gastroenterology;;   REPLACEMENT TOTAL KNEE Left 2010   TUBAL LIGATION      Medications Prior to Admission  Medication Sig Dispense Refill Last Dose/Taking   albuterol  (PROVENTIL  HFA;VENTOLIN  HFA) 108 (90 Base) MCG/ACT inhaler Inhale 2 puffs into the lungs  every 6 (six) hours as needed for wheezing or shortness of breath. 1 Inhaler 0 03/26/2024   azithromycin (ZITHROMAX) 250 MG tablet Take 250 mg by mouth daily.   03/26/2024   baclofen  (LIORESAL ) 10 MG tablet Take 1 tablet (10 mg total) by mouth at bedtime as needed. 15 each 0 Unknown   clobetasol ointment (TEMOVATE) 0.05 % Apply 1 Application topically 2 (two) times daily.   03/26/2024   estradiol (ESTRACE) 0.1 MG/GM  vaginal cream Place 1 g vaginally 2 (two) times a week.   Past Week   fexofenadine  (ALLEGRA ) 180 MG tablet Take 1 tablet (180 mg total) by mouth daily. 30 tablet 1 Past Week   fluticasone (FLONASE) 50 MCG/ACT nasal spray Place 2 sprays into both nostrils daily.   Past Week   furosemide  (LASIX ) 20 MG tablet Take 20 mg by mouth daily.   Past Week   ipratropium-albuterol  (DUONEB) 0.5-2.5 (3) MG/3ML SOLN Inhale 3 mLs into the lungs every 6 (six) hours as needed (WHEEZING).   03/26/2024   levothyroxine  (SYNTHROID , LEVOTHROID) 175 MCG tablet Take 175 mcg by mouth daily.   03/26/2024 Morning   metFORMIN  (GLUCOPHAGE -XR) 500 MG 24 hr tablet Take 1,000 mg by mouth 2 (two) times daily with a meal.   03/26/2024 Morning   pantoprazole  (PROTONIX ) 40 MG tablet Take 1 tablet by mouth 2 (two) times daily.   03/26/2024 Morning   [EXPIRED] predniSONE  (DELTASONE ) 50 MG tablet Take 50 mg by mouth daily with breakfast.   03/26/2024 Morning   Semaglutide  14 MG TABS Take 14 mg by mouth daily.   03/26/2024 Morning   TRELEGY ELLIPTA 200-62.5-25 MCG/ACT AEPB Inhale 1 puff into the lungs daily.   03/26/2024   [DISCONTINUED] losartan  (COZAAR ) 50 MG tablet Take 50 mg by mouth daily.   03/27/2024 Morning   Elastic Bandages & Supports (T.E.D. KNEE LENGTH/XL-LONG) MISC 2 each by Does not apply route daily as needed. 2 each 0    FEROSUL 325 (65 Fe) MG tablet Take 325 mg by mouth every morning.      fluticasone-salmeterol (ADVAIR) 250-50 MCG/ACT AEPB Inhale 1 puff into the lungs every 12 (twelve) hours. (Patient not taking: Reported on 03/27/2024)   Not Taking   magnesium oxide (MAG-OX) 400 (240 Mg) MG tablet Take 400 mg by mouth daily.      prazosin (MINIPRESS) 1 MG capsule Take 1 mg by mouth at bedtime. (Patient not taking: Reported on 03/27/2024)   Not Taking   trospium (SANCTURA) 20 MG tablet Take 20 mg by mouth 2 (two) times daily. (Patient not taking: Reported on 03/27/2024)   Not Taking   Social History   Socioeconomic History    Marital status: Widowed    Spouse name: Not on file   Number of children: Not on file   Years of education: Not on file   Highest education level: Not on file  Occupational History   Not on file  Tobacco Use   Smoking status: Former    Current packs/day: 0.50    Average packs/day: 0.5 packs/day for 10.0 years (5.0 ttl pk-yrs)    Types: Cigarettes   Smokeless tobacco: Never   Tobacco comments:    few cigarettes per week  Vaping Use   Vaping status: Never Used  Substance and Sexual Activity   Alcohol use: Not Currently    Comment: rarely   Drug use: Not Currently    Types: Marijuana    Comment: once in a while   Sexual activity: Not on  file  Other Topics Concern   Not on file  Social History Narrative   Not on file   Social Drivers of Health   Financial Resource Strain: Low Risk  (03/24/2024)   Received from [facility]   Overall Financial Resource Strain (CARDIA)    Difficulty of Paying Living Expenses: Not very hard  Food Insecurity: No Food Insecurity (03/27/2024)   Hunger Vital Sign    Worried About Running Out of Food in the Last Year: Never true    Ran Out of Food in the Last Year: Never true  Transportation Needs: No Transportation Needs (03/27/2024)   PRAPARE - Administrator, Civil Service (Medical): No    Lack of Transportation (Non-Medical): No  Physical Activity: Not on file  Stress: Not on file  Social Connections: Moderately Isolated (03/27/2024)   Social Connection and Isolation Panel    Frequency of Communication with Friends and Family: Three times a week    Frequency of Social Gatherings with Friends and Family: Once a week    Attends Religious Services: More than 4 times per year    Active Member of [employer] or Organizations: No    Attends Banker Meetings: Never    Marital Status: Widowed  Intimate Partner Violence: Not At Risk (03/27/2024)   Humiliation, Afraid, Rape, and Kick questionnaire    Fear of Current  or Ex-Partner: No    Emotionally Abused: No    Physically Abused: No    Sexually Abused: No    Family History  Problem Relation Age of Onset   Breast cancer Sister 36       half sister on fathers side   Breast cancer Maternal Grandmother    Breast cancer Niece      Vitals:   03/28/24 2337 03/29/24 0356 03/29/24 0500 03/29/24 0753  BP: 124/61 (!) 154/110  (!) 174/94  Pulse: 74 74  70  Resp: 20 20  20   Temp: 98.2 F (36.8 C) 98 F (36.7 C)  97.9 F (36.6 C)  TempSrc: Oral Oral  Oral  SpO2: 90% 95%  94%  Weight:   123.9 kg   Height:        PHYSICAL EXAM General: Well appearing female, well nourished, in no acute distress. HEENT: Normocephalic and atraumatic. Neck: No JVD.  Lungs: Normal respiratory effort on room air. Clear bilaterally to auscultation. No wheezes, crackles, rhonchi.  Heart: HRRR. Normal S1 and S2 without gallops or murmurs.  Abdomen: Non-distended appearing.  Msk: Normal strength and tone for age. Extremities: Warm and well perfused. No clubbing, cyanosis. No edema.  Neuro: Alert and oriented X 3. Psych: Answers questions appropriately.   Labs: Basic Metabolic Panel: Recent Labs    03/27/24 1732 03/28/24 0453 03/29/24 0334  NA  --  138 141  K 4.1 3.6 4.1  CL  --  99 103  CO2  --  31 29  GLUCOSE  --  137* 133*  BUN  --  31* 32*  CREATININE  --  0.83 0.89  CALCIUM  --  8.4* 8.5*  MG 2.0  --   --    Liver Function Tests: No results for input(s): AST, ALT, ALKPHOS, BILITOT, PROT, ALBUMIN in the last 72 hours. No results for input(s): LIPASE, AMYLASE in the last 72 hours. CBC: Recent Labs    03/26/24 1427 03/29/24 0334  WBC 8.8 10.8*  HGB 10.4* 10.9*  HCT 34.8* 35.4*  MCV 87.9 85.9  PLT 314 321  Cardiac Enzymes: Recent Labs    03/26/24 1427 03/26/24 1724  TROPONINIHS 26* 28*   BNP: Recent Labs    03/26/24 1427  BNP 86.9   D-Dimer: No results for input(s): DDIMER in the last 72 hours. Hemoglobin  A1C: Recent Labs    03/27/24 0924  HGBA1C 7.6*   Fasting Lipid Panel: No results for input(s): CHOL, HDL, LDLCALC, TRIG, CHOLHDL, LDLDIRECT in the last 72 hours. Thyroid Function Tests: No results for input(s): TSH, T4TOTAL, T3FREE, THYROIDAB in the last 72 hours.  Invalid input(s): FREET3 Anemia Panel: No results for input(s): VITAMINB12, FOLATE, FERRITIN, TIBC, IRON, RETICCTPCT in the last 72 hours.   Radiology: CT Angio Chest PE W/Cm &/Or Wo Cm Result Date: 03/27/2024 CLINICAL DATA:  Pulmonary embolism (PE) suspected, high prob. Shortness of breath, chest pain EXAM: CT ANGIOGRAPHY CHEST WITH CONTRAST TECHNIQUE: Multidetector CT imaging of the chest was performed using the standard protocol during bolus administration of intravenous contrast. Multiplanar CT image reconstructions and MIPs were obtained to evaluate the vascular anatomy. RADIATION DOSE REDUCTION: This exam was performed according to the departmental dose-optimization program which includes automated exposure control, adjustment of the mA and/or kV according to patient size and/or use of iterative reconstruction technique. CONTRAST:  OMNIPAQUE  IOHEXOL  350 MG/ML SOLN COMPARISON:  02/25/2024 FINDINGS: Cardiovascular: Cardiomegaly. Aorta normal caliber with scattered calcifications. No filling defects in the pulmonary arteries to suggest pulmonary emboli. Mediastinum/Nodes: No mediastinal, hilar, or axillary adenopathy. Trachea and esophagus are unremarkable. Thyroid unremarkable. Lungs/Pleura: Predominantly linear opacities in the lung bases most compatible with atelectasis. Trace bilateral pleural effusions. Right mid lung atelectasis anteriorly. Upper Abdomen: No acute findings Musculoskeletal: Chest wall soft tissues are unremarkable. No acute bony abnormality. Review of the MIP images confirms the above findings. IMPRESSION: No evidence of pulmonary embolus. Cardiomegaly. Trace bilateral  pleural effusions with right mid lung and bibasilar atelectasis. Aortic Atherosclerosis (ICD10-I70.0). Electronically Signed   By: Franky Crease M.D.   On: 03/27/2024 00:46   DG Chest 2 View Result Date: 03/26/2024 CLINICAL DATA:  Chest pain and shortness of breath. The pain is midsternal and radiates down her back. EXAM: CHEST - 2 VIEW COMPARISON:  02/25/2024.  Chest CT dated 02/25/2024. FINDINGS: Stable enlarged cardiac silhouette and prominent pulmonary vasculature. Clear lungs. Small amount of bilateral pleural fluid or thickening posteriorly. There was a commensurate degree of pleural thickening on the recent CT. Thoracic spine degenerative changes including changes of DISH. IMPRESSION: 1. Stable cardiomegaly and pulmonary vascular congestion. 2. Small amount of bilateral pleural fluid or thickening. There was corresponding thickening on the recent CT. Electronically Signed   By: Elspeth Bathe M.D.   On: 03/26/2024 15:53    ECHO 02/2024: NORMAL LEFT VENTRICULAR SYSTOLIC FUNCTION WITH NO LVH  ESTIMATED EF: >55%, CALC EF(2D): 59%  INDETERMINATE DIASTOLIC FUNCTION  NORMAL RIGHT VENTRICULAR SYSTOLIC FUNCTION  VALVULAR REGURGITATION: No AR, MILD MR, TRIVIAL PR, MILD TR  ESTIMATED RVSP: 45 mmHg (ABNORMAL)  NO VALVULAR STENOSIS   TELEMETRY reviewed by me 03/29/2024: NSR rate 70s  EKG reviewed by me: NSR rate 78 bpm  Data reviewed by me 03/29/2024: last 24h vitals tele labs imaging I/O hospitalist progress note  Principal Problem:   Acute on chronic diastolic CHF (congestive heart failure) (HCC) Active Problems:   Diabetes mellitus without complication (HCC)   Obesity, Class III, BMI 40-49.9 (morbid obesity)   Chronic obstructive pulmonary disease (COPD) (HCC)   Pulmonary hypertension (HCC)   HTN (hypertension)   Acute dyspnea   Chronic hypoxic  respiratory failure (HCC)    ASSESSMENT AND PLAN:  [name] is a 68 y.o. female  with a past medical history of tobacco use, COPD, diabetes,  hypertension who presented to the ED on 03/26/2024 for dizziness and headache. Also with exertional dyspnea recently BNP normal. Cardiology was consulted for further evaluation.   # Exertional dyspnea # COPD # Mild pulmonary hypertension # Headaches, dizziness, resolved # Hypertension Patient with recent symptoms of headaches, dizziness, exertional dyspnea presented to the ED for further evaluation.  BNP normal limits.  CTA chest demonstrated trace pleural effusions bilaterally with no evidence of PE.  She was started on IV Lasix  in the ED as well as steroids and nebulizers. -Ordered PO Lasix  20 mg daily. -Further management of COPD as per primary team.  Patient should follow-up as scheduled outpatient with pulmonology. -Would benefit from evaluation for obstructive sleep apnea. -Increased losartan  to 75 mg daily.   -Ordered spironolactone  25 mg daily for additional BP control.   This patient's plan of care was discussed and created with Dr. Florencio and he is in agreement.  Signed: Layney Gillson, PA-C  03/29/2024, 9:42 AM [facility] Cardiology

## 2024-03-29 NOTE — TOC CM/SW Note (Signed)
 Transition of Care [facility]) - Inpatient Brief Assessment   Patient Details  Name: [name] MRN: [id number] Date of Birth: [birth year]  Transition of Care [facility]) CM/SW Contact:    Lauraine JAYSON Carpen, LCSW Phone Number: 03/29/2024, 11:09 AM   Clinical Narrative: Patient has orders to discharge home today. Chart reviewed. No TOC needs identified. RN repeated sats test today and the lowest she dropped to was 92%. CSW signing off.  Transition of Care Asessment: Insurance and Status: Insurance coverage has been reviewed Patient has primary care physician: Yes Home environment has been reviewed: Mobile home Prior level of function:: Not documented Prior/Current Home Services: No current home services Social Drivers of Health Review: SDOH reviewed no interventions necessary Readmission risk has been reviewed: Yes Transition of care needs: no transition of care needs at this time

## 2024-03-29 NOTE — Progress Notes (Signed)
   03/29/24 1200  Spiritual Encounters  Type of Visit Initial  Care provided to: Patient  Referral source Patient request  Reason for visit Advance directives  OnCall Visit Yes  Interventions  Spiritual Care Interventions Made Established relationship of care and support;Compassionate presence;Encouragement  Intervention Outcomes  Outcomes Connection to spiritual care  Advance Directives (For Healthcare)  Does Patient Have a Medical Advance Directive? No  Would patient like information on creating a medical advance directive? Yes (Inpatient - patient requests chaplain consult to create a medical advance directive)   Chaplain assisted patient with an Advance Directive. Chaplain explain document and allowed patient to complete what they could. In order for document to be completed a notary and two witnesses are needed. Chaplain left patient and called notaries in mother baby and Behavorial Health. No one was available at the time. Chaplain will continue to reach out to notaries.

## 2024-03-29 NOTE — Care Management Important Message (Signed)
 Important Message  Patient Details  Name: [name] MRN: [id number] Date of Birth: 14-Dec-1955   Important Message Given:  Yes - Medicare IM     Rojelio SHAUNNA Rattler 03/29/2024, 2:06 PM

## 2024-03-29 NOTE — Discharge Summary (Signed)
 Physician Discharge Summary   Patient: [name] MRN: [id number] DOB: 1955-11-02  Admit date:     03/26/2024  Discharge date: 03/29/24  Discharge Physician: AIDA CHO   PCP: Perri Constance Sor, PA-C   Recommendations at discharge:   Follow-up with PCP in 1 week  Discharge Diagnoses: Principal Problem:   Acute on chronic diastolic CHF (congestive heart failure) (HCC) Active Problems:   Acute dyspnea   Diabetes mellitus without complication (HCC)   Obesity, Class III, BMI 40-49.9 (morbid obesity)   Chronic obstructive pulmonary disease (COPD) (HCC)   Pulmonary hypertension (HCC)   HTN (hypertension)   Acute respiratory failure with hypoxia (HCC)  Resolved Problems:   * No resolved hospital problems. *  Hospital Course:  [name] is a 68 y.o. female with medical history significant for morbid obesity, COPD, diabetes mellitus, hypertension, mild pulmonary hypertension.  She was recently evaluated by the cardiologist on 03/24/2024 for dyspnea on exertion.  She was also seen by her PCP on 03/23/2024 for shortness of breath and was diagnosed with COPD exacerbation and wheezing.  She was prescribed prednisone  and azithromycin for 5 days   She presented to the emergency department on 03/26/2024 because of because of exertional shortness of breath, chest discomfort and abdominal distention and increasing lower extremity edema.   She was admitted to the hospital for exacerbation of likely chronic diastolic CHF.   During hospitalization, she complained of dysuria and was concerned about UTI because she has had recurrent UTIs.  She also said she has kidney stones and a bladder sling in place.  She sees a Warehouse manager in the outpatient setting.  Assessment and Plan:   Acute on chronic diastolic CHF: Patient was hypervolemic on presentation.  She responded well to IV Lasix .  She will be discharged on oral Lasix  and Aldactone  losartan  was increased from 50 to 75 mg daily  per cardiologist recommendation.  Recent 2D echo on 03/08/2024 showed EF estimated at greater than 55%, indeterminate diastolic function, normal RV systolic function, mild pulmonary hypertension. BNP was normal probably because of morbid obesity   Recent diagnosis of COPD exacerbation prior to admission: Continue bronchodilators. Completed 5 days of prednisone  on 03/29/2023.  She had taken prednisone  and azithromycin for 3 days prior to admission. History of personal tobacco use order and secondhand tobacco exposure     Dyspnea on exertion most likely multifactorial from CHF, COPD, morbid obesity and pulmonary hypertension     Acute hypoxic respiratory failure: Resolved.  Repeat oxygen saturation with ambulation on room air was 92%.  Chronic hypoxic respiratory failure has been ruled out for now.  Close outpatient follow-up with PCP recommended.     Acute UTI, dysuria: Dysuria has improved.  No other urinary symptoms.  Urine culture growing Klebsiella pneumonia.  Sensitivity report is pending. S/p treatment with IV ceftriaxone  for 2 days.  She wants to be discharged home today on oral antibiotics.  She does not want to wait for cultures despite recommendation to wait for cultures because of history of recurrent UTIs.  She will be discharged on oral Keflex .  She has tolerated this in the past. Previous urine culture from 02/24/2024 showed E. coli that was pansensitive.  Previous urine culture from 08/21/2023 showed Klebsiella pneumoniae that was resistant to ampicillin, Bactrim and nitrofurantoin but sensitive to cefazolin, ceftriaxone , cefepime, carbapenems and Zosyn.     Type II DM with hyperglycemia: Continue metformin  and semaglutide  at discharge.       Morbid obesity: This complicates  overall care and prognosis.  Recommended outpatient sleep study for evaluation of sleep apnea.     Comorbidities include hypertension, hyperlipidemia, hypothyroidism, depression, anxiety, arthritis, bipolar  disorder    Her condition has improved and she is deemed stable for discharge to home today.       Consultants: Cardiologist Procedures performed: None Disposition: Home Diet recommendation:  Discharge Diet Orders (From admission, onward)     Start     Ordered   03/29/24 0000  Diet - low sodium heart healthy        03/29/24 0940   03/29/24 0000  Diet Carb Modified        03/29/24 0940           Cardiac and Carb modified diet DISCHARGE MEDICATION: Allergies as of 03/29/2024       Reactions   Chloraseptic Max Sore Throat  [phenol-glycerin] Anaphylaxis   Tramadol Other (See Comments), Nausea And Vomiting   Other Reaction: FELT DRUNK, NAUSEA & VOMITING tramadol   Benzocaine -menthol Other (See Comments)   Throat swelling.   Haloperidol Other (See Comments)   Lock jaw haloperidol   Haloperidol Decanoate Other (See Comments)   Other Reaction: LOCK JAW haloperidol decanoate        Medication List     STOP taking these medications    azithromycin 250 MG tablet Commonly known as: ZITHROMAX   fluticasone-salmeterol 250-50 MCG/ACT Aepb Commonly known as: ADVAIR   predniSONE  50 MG tablet Commonly known as: DELTASONE        TAKE these medications    albuterol  108 (90 Base) MCG/ACT inhaler Commonly known as: VENTOLIN  HFA Inhale 2 puffs into the lungs every 6 (six) hours as needed for wheezing or shortness of breath.   baclofen  10 MG tablet Commonly known as: LIORESAL  Take 1 tablet (10 mg total) by mouth at bedtime as needed.   cephALEXin  500 MG capsule Commonly known as: KEFLEX  Take 1 capsule (500 mg total) by mouth 4 (four) times daily for 5 days. Start taking on: March 30, 2024   clobetasol ointment 0.05 % Commonly known as: TEMOVATE Apply 1 Application topically 2 (two) times daily.   estradiol 0.1 MG/GM vaginal cream Commonly known as: ESTRACE Place 1 g vaginally 2 (two) times a week.   FeroSul 325 (65 Fe) MG tablet Generic drug: ferrous  sulfate Take 325 mg by mouth every morning.   fexofenadine  180 MG tablet Commonly known as: ALLEGRA  Take 1 tablet (180 mg total) by mouth daily.   fluconazole  150 MG tablet Commonly known as: Diflucan  Take 1 tablet (150 mg total) by mouth once as needed for up to 1 dose (vulvovaginal candidiasis).   fluticasone 50 MCG/ACT nasal spray Commonly known as: FLONASE Place 2 sprays into both nostrils daily.   furosemide  20 MG tablet Commonly known as: LASIX  Take 20 mg by mouth daily.   ipratropium-albuterol  0.5-2.5 (3) MG/3ML Soln Commonly known as: DUONEB Inhale 3 mLs into the lungs every 6 (six) hours as needed (WHEEZING).   levothyroxine  175 MCG tablet Commonly known as: SYNTHROID  Take 175 mcg by mouth daily.   losartan  50 MG tablet Commonly known as: COZAAR  Take 1.5 tablets (75 mg total) by mouth daily. What changed: how much to take   magnesium oxide 400 (240 Mg) MG tablet Commonly known as: MAG-OX Take 400 mg by mouth daily.   metFORMIN  500 MG 24 hr tablet Commonly known as: GLUCOPHAGE -XR Take 1,000 mg by mouth 2 (two) times daily with a meal.   pantoprazole  40  MG tablet Commonly known as: PROTONIX  Take 1 tablet by mouth 2 (two) times daily.   prazosin 1 MG capsule Commonly known as: MINIPRESS Take 1 mg by mouth at bedtime.   Semaglutide  14 MG Tabs Take 14 mg by mouth daily.   spironolactone  25 MG tablet Commonly known as: ALDACTONE  Take 1 tablet (25 mg total) by mouth daily. Start taking on: March 30, 2024   T.E.D. Knee Length/XL-Long Misc 2 each by Does not apply route daily as needed.   Trelegy Ellipta 200-62.5-25 MCG/ACT Aepb Generic drug: Fluticasone-Umeclidin-Vilant Inhale 1 puff into the lungs daily.   trospium 20 MG tablet Commonly known as: SANCTURA Take 20 mg by mouth 2 (two) times daily.               Durable Medical Equipment  (From admission, onward)           Start     Ordered   03/29/24 0000  For home use only DME  oxygen       Question Answer Comment  Length of Need Lifetime   Mode or (Route) Nasal cannula   Liters per Minute 2   Frequency Continuous (stationary and portable oxygen unit needed)   Oxygen conserving device Yes   Oxygen delivery system Gas      03/29/24 0940            Follow-up Information     Alluri, Keller BROCKS, MD. Go in 1 week(s).   Specialty: Cardiology Why: Hospital Follow up: August 27th @ 11 AM. Please arrive 15 minutes early.   [address] [place], [place] [address]  (This is the new adress for the [facility] location) Contact information: [address] [place] [address] [phone]                Discharge Exam: Fredricka Weights   03/26/24 1425 03/28/24 0500 03/29/24 0500  Weight: 122.5 kg 123.5 kg 123.9 kg   GEN: NAD SKIN: Warm and dry EYES: No pallor or icterus ENT: MMM CV: RRR PULM: CTA B ABD: soft, obese, NT, +BS CNS: AAO x 3, non focal EXT: No edema or tenderness   Condition at discharge: good  The results of significant diagnostics from this hospitalization (including imaging, microbiology, ancillary and laboratory) are listed below for reference.   Imaging Studies: CT Angio Chest PE W/Cm &/Or Wo Cm Result Date: 03/27/2024 CLINICAL DATA:  Pulmonary embolism (PE) suspected, high prob. Shortness of breath, chest pain EXAM: CT ANGIOGRAPHY CHEST WITH CONTRAST TECHNIQUE: Multidetector CT imaging of the chest was performed using the standard protocol during bolus administration of intravenous contrast. Multiplanar CT image reconstructions and MIPs were obtained to evaluate the vascular anatomy. RADIATION DOSE REDUCTION: This exam was performed according to the departmental dose-optimization program which includes automated exposure control, adjustment of the mA and/or kV according to patient size and/or use of iterative reconstruction technique. CONTRAST:  OMNIPAQUE  IOHEXOL  350 MG/ML SOLN COMPARISON:  02/25/2024 FINDINGS:  Cardiovascular: Cardiomegaly. Aorta normal caliber with scattered calcifications. No filling defects in the pulmonary arteries to suggest pulmonary emboli. Mediastinum/Nodes: No mediastinal, hilar, or axillary adenopathy. Trachea and esophagus are unremarkable. Thyroid unremarkable. Lungs/Pleura: Predominantly linear opacities in the lung bases most compatible with atelectasis. Trace bilateral pleural effusions. Right mid lung atelectasis anteriorly. Upper Abdomen: No acute findings Musculoskeletal: Chest wall soft tissues are unremarkable. No acute bony abnormality. Review of the MIP images confirms the above findings. IMPRESSION: No evidence of pulmonary embolus. Cardiomegaly. Trace bilateral pleural effusions with  right mid lung and bibasilar atelectasis. Aortic Atherosclerosis (ICD10-I70.0). Electronically Signed   By: Franky Crease M.D.   On: 03/27/2024 00:46   DG Chest 2 View Result Date: 03/26/2024 CLINICAL DATA:  Chest pain and shortness of breath. The pain is midsternal and radiates down her back. EXAM: CHEST - 2 VIEW COMPARISON:  02/25/2024.  Chest CT dated 02/25/2024. FINDINGS: Stable enlarged cardiac silhouette and prominent pulmonary vasculature. Clear lungs. Small amount of bilateral pleural fluid or thickening posteriorly. There was a commensurate degree of pleural thickening on the recent CT. Thoracic spine degenerative changes including changes of DISH. IMPRESSION: 1. Stable cardiomegaly and pulmonary vascular congestion. 2. Small amount of bilateral pleural fluid or thickening. There was corresponding thickening on the recent CT. Electronically Signed   By: Elspeth Bathe M.D.   On: 03/26/2024 15:53    Microbiology: Results for orders placed or performed during the hospital encounter of 02/24/24  Urine Culture     Status: Abnormal   Collection Time: 02/24/24  8:34 AM   Specimen: Urine, Random  Result Value Ref Range Status   Specimen Description   Final    URINE, RANDOM Performed at  [facility], [address], [place], [place] [address]    Special Requests   Final    NONE Reflexed from [id number] Performed at [facility], [address], [place], [place] [address]    Culture 10,000 COLONIES/mL ESCHERICHIA COLI (A)  Final   Report Status 02/26/2024 FINAL  Final   Organism ID, Bacteria ESCHERICHIA COLI (A)  Final      Susceptibility   Escherichia coli - MIC*    AMPICILLIN <=2 SENSITIVE Sensitive     CEFAZOLIN <=4 SENSITIVE Sensitive     CEFEPIME <=0.12 SENSITIVE Sensitive     CEFTRIAXONE  <=0.25 SENSITIVE Sensitive     CIPROFLOXACIN <=0.25 SENSITIVE Sensitive     GENTAMICIN <=1 SENSITIVE Sensitive     IMIPENEM <=0.25 SENSITIVE Sensitive     NITROFURANTOIN <=16 SENSITIVE Sensitive     TRIMETH/SULFA <=20 SENSITIVE Sensitive     AMPICILLIN/SULBACTAM <=2 SENSITIVE Sensitive     PIP/TAZO <=4 SENSITIVE Sensitive ug/mL    * 10,000 COLONIES/mL ESCHERICHIA COLI    Labs: CBC: Recent Labs  Lab 03/26/24 1427 03/29/24 0334  WBC 8.8 10.8*  HGB 10.4* 10.9*  HCT 34.8* 35.4*  MCV 87.9 85.9  PLT 314 321   Basic Metabolic Panel: Recent Labs  Lab 03/26/24 1427 03/27/24 0924 03/27/24 1732 03/28/24 0453 03/29/24 0334  NA 137 140  --  138 141  K 4.1 4.0 4.1 3.6 4.1  CL 100 98  --  99 103  CO2 26 31  --  31 29  GLUCOSE 231* 147*  --  137* 133*  BUN 36* 29*  --  31* 32*  CREATININE 0.93 0.77  --  0.83 0.89  CALCIUM 9.2 8.3*  --  8.4* 8.5*  MG  --   --  2.0  --   --    Liver Function Tests: No results for input(s): AST, ALT, ALKPHOS, BILITOT, PROT, ALBUMIN in the last 168 hours. CBG: Recent Labs  Lab 03/28/24 1208 03/28/24 1556 03/28/24 2202 03/29/24 0353 03/29/24 0754  GLUCAP 181* 228* 157* 128* 145*    Discharge time spent: greater than 30 minutes.  Signed: AIDA CHO, MD [facility] 03/29/2024

## 2024-03-29 NOTE — Progress Notes (Addendum)
 Chaplain on the way to fill out advance directive paperwork with patient prior to d/c.  1400: Notaries found and assisted with paperwork at bedside with chaplain. Volunteers are on the way with wheelchair to d/c patient.

## 2024-03-29 NOTE — Plan of Care (Signed)
  Problem: Education: Goal: Ability to describe self-care measures that may prevent or decrease complications (Diabetes Survival Skills Education) will improve Outcome: Progressing   Problem: Fluid Volume: Goal: Ability to maintain a balanced intake and output will improve Outcome: Progressing   Problem: Health Behavior/Discharge Planning: Goal: Ability to identify and utilize available resources and services will improve Outcome: Progressing   Problem: Metabolic: Goal: Ability to maintain appropriate glucose levels will improve Outcome: Progressing   Problem: Health Behavior/Discharge Planning: Goal: Ability to manage health-related needs will improve Outcome: Progressing

## 2024-03-29 NOTE — Progress Notes (Signed)
 Pt would like to inquire about: - possible d/c today? - d/c w/ antibiotics for UTI & antifungal?

## 2024-03-30 LAB — URINE CULTURE: Culture: >=100000 — AB

## 2024-08-09 ENCOUNTER — Ambulatory Visit
Admission: RE | Admit: 2024-08-09 | Discharge: 2024-08-09 | Disposition: A | Discharge status: Home / Self Care | Attending: Emergency Medicine | Admitting: Emergency Medicine

## 2024-08-09 VITALS — BP 134/82 | HR 88 | Temp 97.4°F | Resp 18

## 2024-08-09 DIAGNOSIS — R21 Rash and other nonspecific skin eruption: Secondary | ICD-10-CM | POA: Diagnosis not present

## 2024-08-09 DIAGNOSIS — N3001 Acute cystitis with hematuria: Secondary | ICD-10-CM | POA: Diagnosis not present

## 2024-08-09 DIAGNOSIS — M25512 Pain in left shoulder: Secondary | ICD-10-CM

## 2024-08-09 DIAGNOSIS — R300 Dysuria: Secondary | ICD-10-CM

## 2024-08-09 DIAGNOSIS — G8929 Other chronic pain: Secondary | ICD-10-CM

## 2024-08-09 DIAGNOSIS — M25511 Pain in right shoulder: Secondary | ICD-10-CM

## 2024-08-09 LAB — POCT URINE DIPSTICK
Bilirubin, UA: NEGATIVE
Glucose, UA: NEGATIVE mg/dL
Nitrite, UA: POSITIVE — AB
Protein Ur, POC: 30 mg/dL — AB
Spec Grav, UA: 1.02
Urobilinogen, UA: 0.2 U/dL
pH, UA: 5.5

## 2024-08-09 MED ORDER — PREDNISONE 10 MG PO TABS: ORAL_TABLET | ORAL | 0 refills | Status: AC

## 2024-08-09 MED ORDER — CLOBETASOL PROP EMOLLIENT BASE 0.05 % EX CREA: 1.0000 | TOPICAL_CREAM | Freq: Two times a day (BID) | CUTANEOUS | 0 refills | Status: AC

## 2024-08-09 MED ORDER — CEFDINIR 300 MG PO CAPS: 300.0000 mg | ORAL_CAPSULE | Freq: Two times a day (BID) | ORAL | 0 refills | Status: AC
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
## 2024-08-09 NOTE — ED Triage Notes (Signed)
 Patient presents to [facility] for possible UTI. Reports back pain, urinary freq x 8-10 days ago. Hx of UTIs.   Also reports bilateral shoulder pain, states she is due for cortisone shot. States pain due to stress. She is traveling so here for eval. Treating symptoms with advil and tylenol . Last dose of both was last night.

## 2024-08-11 ENCOUNTER — Ambulatory Visit (HOSPITAL_COMMUNITY): Payer: Self-pay

## 2024-08-11 LAB — URINE CULTURE: Culture: >=100000 — AB

## 2024-08-16 ENCOUNTER — Ambulatory Visit
Admission: RE | Admit: 2024-08-16 | Discharge: 2024-08-16 | Disposition: A | Discharge status: Home / Self Care | Attending: Family Medicine | Admitting: Family Medicine

## 2024-08-16 VITALS — BP 138/91 | HR 81 | Temp 98.4°F | Resp 18 | Wt 258.0 lb

## 2024-08-16 DIAGNOSIS — M79672 Pain in left foot: Secondary | ICD-10-CM

## 2024-08-16 DIAGNOSIS — M79671 Pain in right foot: Secondary | ICD-10-CM | POA: Diagnosis not present

## 2024-08-16 MED ORDER — MICONAZOLE NITRATE 2 % EX CREA: 1.0000 | TOPICAL_CREAM | Freq: Two times a day (BID) | CUTANEOUS | 0 refills | Status: AC

## 2024-08-16 MED ORDER — DICLOFENAC SODIUM 1 % EX GEL: 2.0000 g | Freq: Four times a day (QID) | CUTANEOUS | Status: AC
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
## 2024-08-16 NOTE — Discharge Instructions (Addendum)
 Follow up with [facility] for a nerve block, if desired. Apply some Voltaren  gel to your feet. Tylenol  1000 mg twice a day as needed for pain.   Apply the miconazole  ointment twice a day for treatment of possible fungal foot infection.
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
## 2024-08-23 ENCOUNTER — Ambulatory Visit
Admission: EM | Admit: 2024-08-23 | Discharge: 2024-08-23 | Disposition: A | Discharge status: Home / Self Care | Attending: Physician Assistant | Admitting: Physician Assistant

## 2024-08-23 ENCOUNTER — Encounter: Payer: Self-pay | Admitting: Family Medicine

## 2024-08-23 DIAGNOSIS — G4733 Obstructive sleep apnea (adult) (pediatric): Secondary | ICD-10-CM

## 2024-08-23 DIAGNOSIS — M2550 Pain in unspecified joint: Secondary | ICD-10-CM

## 2024-08-23 DIAGNOSIS — I10 Essential (primary) hypertension: Secondary | ICD-10-CM

## 2024-08-23 DIAGNOSIS — R519 Headache, unspecified: Secondary | ICD-10-CM

## 2024-08-23 MED ORDER — LOSARTAN POTASSIUM 100 MG PO TABS: 100.0000 mg | ORAL_TABLET | Freq: Every day | ORAL | 2 refills | Status: AC
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
## 2024-08-23 NOTE — ED Triage Notes (Signed)
 Pt present elevated blood pressure, symptoms started Today. Pt state she was having a sleep study and BP was elevated and they sent her here.

## 2024-08-23 NOTE — Discharge Instructions (Addendum)
-  I increased your losartan  from 75 mg to 100 mg -Keep appointments for ortho about your pain -Keep appointment with pulmonology about sleep apnea. That can play a role in your high blood pressure.  -Make appointment with PCP for follow up - It would be helpful if you could get a blood pressure cuff and check your blood pressure at home.  Write down your readings and follow-up with your primary. - If you have worsening headaches, chest pain, shortness of breath, palpitations, leg swelling, weakness, numbness/tingling, facial droop, blurred vision or feeling worse please contact 911 or go to the ER immediately.

## 2024-08-25 ENCOUNTER — Other Ambulatory Visit: Payer: Self-pay | Admitting: Family

## 2024-08-25 DIAGNOSIS — Z1231 Encounter for screening mammogram for malignant neoplasm of breast: Secondary | ICD-10-CM

## 2024-09-22 ENCOUNTER — Ambulatory Visit
